# Patient Record
Sex: Female | Born: 1980 | Race: White | Hispanic: No | Marital: Single | Smoking: Former smoker
Health system: Southern US, Community
[De-identification: ages and names within clinical notes are randomized; demographics above are authoritative.]

## PROBLEM LIST (undated history)

## (undated) DIAGNOSIS — N809 Endometriosis, unspecified: Secondary | ICD-10-CM

## (undated) DIAGNOSIS — N200 Calculus of kidney: Secondary | ICD-10-CM

## (undated) DIAGNOSIS — N83209 Unspecified ovarian cyst, unspecified side: Secondary | ICD-10-CM

## (undated) DIAGNOSIS — Z789 Other specified health status: Secondary | ICD-10-CM

## (undated) HISTORY — PX: TONSILLECTOMY: SUR1361

## (undated) HISTORY — PX: LAPAROSCOPY: SHX197

---

## 2005-02-12 ENCOUNTER — Emergency Department: Payer: Self-pay | Admitting: Unknown Physician Specialty

## 2005-04-24 ENCOUNTER — Other Ambulatory Visit: Payer: Self-pay

## 2005-04-24 ENCOUNTER — Emergency Department: Payer: Self-pay | Admitting: Emergency Medicine

## 2005-05-18 ENCOUNTER — Emergency Department: Payer: Self-pay | Admitting: Emergency Medicine

## 2005-05-18 ENCOUNTER — Other Ambulatory Visit: Payer: Self-pay

## 2005-08-05 ENCOUNTER — Emergency Department: Payer: Self-pay | Admitting: Emergency Medicine

## 2007-01-14 ENCOUNTER — Emergency Department: Payer: Self-pay | Admitting: Emergency Medicine

## 2007-05-01 ENCOUNTER — Emergency Department: Payer: Self-pay | Admitting: Emergency Medicine

## 2008-05-10 ENCOUNTER — Ambulatory Visit: Payer: Self-pay | Admitting: *Deleted

## 2009-02-25 ENCOUNTER — Emergency Department: Payer: Self-pay | Admitting: Emergency Medicine

## 2009-03-11 ENCOUNTER — Ambulatory Visit: Payer: Self-pay | Admitting: Family Medicine

## 2009-07-04 ENCOUNTER — Emergency Department: Payer: Self-pay | Admitting: Emergency Medicine

## 2010-11-03 ENCOUNTER — Emergency Department: Payer: Self-pay | Admitting: Emergency Medicine

## 2010-11-05 ENCOUNTER — Emergency Department: Payer: Self-pay | Admitting: Emergency Medicine

## 2012-04-10 ENCOUNTER — Emergency Department: Payer: Self-pay | Admitting: Emergency Medicine

## 2012-04-10 LAB — URINALYSIS, COMPLETE
Bilirubin,UR: NEGATIVE
Glucose,UR: NEGATIVE mg/dL (ref 0–75)
Leukocyte Esterase: NEGATIVE
Nitrite: NEGATIVE
Ph: 6 (ref 4.5–8.0)
Protein: NEGATIVE
RBC,UR: 40 /HPF (ref 0–5)
Specific Gravity: 1.024 (ref 1.003–1.030)
Squamous Epithelial: 1
WBC UR: 1 /HPF (ref 0–5)

## 2012-04-10 LAB — PREGNANCY, URINE: Pregnancy Test, Urine: NEGATIVE m[IU]/mL

## 2012-05-14 ENCOUNTER — Emergency Department: Payer: Self-pay | Admitting: Internal Medicine

## 2012-07-05 ENCOUNTER — Emergency Department: Payer: Self-pay | Admitting: Emergency Medicine

## 2012-07-05 LAB — URINALYSIS, COMPLETE
Bilirubin,UR: NEGATIVE
Blood: NEGATIVE
Glucose,UR: NEGATIVE mg/dL (ref 0–75)
Ketone: NEGATIVE
Nitrite: NEGATIVE
Ph: 7 (ref 4.5–8.0)
Protein: NEGATIVE
RBC,UR: 5 /HPF (ref 0–5)
Specific Gravity: 1.02 (ref 1.003–1.030)
Squamous Epithelial: 3
WBC UR: 2 /HPF (ref 0–5)

## 2012-12-13 ENCOUNTER — Emergency Department: Payer: Self-pay | Admitting: Emergency Medicine

## 2012-12-13 LAB — URINALYSIS, COMPLETE
Bacteria: NONE SEEN
Bilirubin,UR: NEGATIVE
Glucose,UR: NEGATIVE mg/dL (ref 0–75)
Ketone: NEGATIVE
Leukocyte Esterase: NEGATIVE
Nitrite: NEGATIVE
Ph: 5 (ref 4.5–8.0)
Protein: NEGATIVE
RBC,UR: 1 /HPF (ref 0–5)
Specific Gravity: 1.02 (ref 1.003–1.030)
Squamous Epithelial: 1
WBC UR: 1 /HPF (ref 0–5)

## 2012-12-13 LAB — WET PREP, GENITAL

## 2012-12-13 LAB — HCG, QUANTITATIVE, PREGNANCY: Beta Hcg, Quant.: <1 m[IU]/mL — ABNORMAL LOW

## 2012-12-22 ENCOUNTER — Encounter (HOSPITAL_COMMUNITY): Payer: Self-pay | Admitting: *Deleted

## 2012-12-22 ENCOUNTER — Inpatient Hospital Stay (HOSPITAL_COMMUNITY): Payer: Self-pay

## 2012-12-22 ENCOUNTER — Inpatient Hospital Stay (HOSPITAL_COMMUNITY)
Admission: AD | Admit: 2012-12-22 | Discharge: 2012-12-23 | Disposition: A | Discharge status: Home / Self Care | Payer: Self-pay | Source: Ambulatory Visit | Attending: Obstetrics & Gynecology | Admitting: Obstetrics & Gynecology

## 2012-12-22 DIAGNOSIS — M549 Dorsalgia, unspecified: Secondary | ICD-10-CM | POA: Insufficient documentation

## 2012-12-22 DIAGNOSIS — K521 Toxic gastroenteritis and colitis: Secondary | ICD-10-CM

## 2012-12-22 DIAGNOSIS — R110 Nausea: Secondary | ICD-10-CM | POA: Insufficient documentation

## 2012-12-22 DIAGNOSIS — N949 Unspecified condition associated with female genital organs and menstrual cycle: Secondary | ICD-10-CM | POA: Insufficient documentation

## 2012-12-22 DIAGNOSIS — S3991XA Unspecified injury of abdomen, initial encounter: Secondary | ICD-10-CM

## 2012-12-22 DIAGNOSIS — K5289 Other specified noninfective gastroenteritis and colitis: Secondary | ICD-10-CM | POA: Insufficient documentation

## 2012-12-22 HISTORY — DX: Other specified health status: Z78.9

## 2012-12-22 LAB — URINALYSIS, ROUTINE W REFLEX MICROSCOPIC
Bilirubin Urine: NEGATIVE
Glucose, UA: NEGATIVE mg/dL
Ketones, ur: NEGATIVE mg/dL
Leukocytes, UA: NEGATIVE
Nitrite: NEGATIVE
Protein, ur: NEGATIVE mg/dL
Specific Gravity, Urine: 1.015 (ref 1.005–1.030)
Urobilinogen, UA: 0.2 mg/dL (ref 0.0–1.0)
pH: 7 (ref 5.0–8.0)

## 2012-12-22 LAB — POCT PREGNANCY, URINE: Preg Test, Ur: NEGATIVE

## 2012-12-22 LAB — CBC
HCT: 39.7 % (ref 36.0–46.0)
Hemoglobin: 13.3 g/dL (ref 12.0–15.0)
MCH: 29.3 pg (ref 26.0–34.0)
MCHC: 33.5 g/dL (ref 30.0–36.0)
MCV: 87.4 fL (ref 78.0–100.0)
Platelets: 271 10*3/uL (ref 150–400)
RBC: 4.54 MIL/uL (ref 3.87–5.11)
RDW: 13 % (ref 11.5–15.5)
WBC: 9.8 10*3/uL (ref 4.0–10.5)

## 2012-12-22 LAB — URINE MICROSCOPIC-ADD ON

## 2012-12-22 MED ORDER — OXYCODONE-ACETAMINOPHEN 5-325 MG PO TABS
2.0000 | ORAL_TABLET | ORAL | Status: AC
  Administered 2012-12-23: 2 via ORAL
  Filled 2012-12-22: qty 2

## 2012-12-22 MED ORDER — OXYCODONE-ACETAMINOPHEN 5-325 MG PO TABS: 2.0000 | ORAL_TABLET | ORAL | Status: DC

## 2012-12-22 NOTE — MAU Provider Note (Signed)
Chief Complaint: No chief complaint on file.   First Provider Initiated Contact with Patient 12/22/12 2216     SUBJECTIVE HPI: Crystal Riley is a 32 y.o. [id number] who presents to maternity admissions reporting pelvic pain , mid abdominal pain, and back pain x3 weeks since falling down stairs at her apartment. She was seen a [facility] for this 1 week ago and was diagnosed with BV and PID and had negative testing for STDs.  She has 6 days left of her PO treatment for PID (following injection given in ER).  Her pain continues in her pelvis and she now has mid abdominal pain on right side that is sharp, stabbing pain that worsens with movement. She also reports daily nausea since the pain started.  Patient's last menstrual period was 10/25/2012.  She has light spotting since her Mirena IUD was placed in 2011.  She vaginal bleeding, vaginal itching/burning, urinary symptoms, h/a, dizziness, or fever/chills.    She lost her insurance and does not currently have a gyn provider.    Past Medical History  Diagnosis Date  . Medical history non-contributory    Past Surgical History  Procedure Laterality Date  . Laparoscopy    . Tonsillectomy     History   Social History  . Marital Status: Single    Spouse Name: N/A    Number of Children: N/A  . Years of Education: N/A   Occupational History  . Not on file.   Social History Main Topics  . Smoking status: Not on file  . Smokeless tobacco: Not on file  . Alcohol Use: 1.1 oz/week    1 Glasses of wine, 1 Drinks containing 0.5 oz of alcohol per week  . Drug Use: Yes    Special: Marijuana  . Sexually Active: Yes   Other Topics Concern  . Not on file   Social History Narrative  . No narrative on file   No current facility-administered medications on file prior to encounter.   No current outpatient prescriptions on file prior to encounter.   Allergies  Allergen Reactions  . Bactrim (Sulfamethoxazole-Tmp Ds) Swelling and Rash  .  Latex Swelling and Rash  . Tylenol With Codeine #3 (Acetaminophen-Codeine) Rash    ROS: Pertinent items in HPI  OBJECTIVE Blood pressure 118/66, pulse 95, temperature 98.1 F (36.7 C), temperature source Oral, resp. rate 20, height 5' (1.524 m), weight 65.998 kg (145 lb 8 oz), last menstrual period 10/25/2012. GENERAL: Well-developed, well-nourished female in no acute distress.  HEENT: Normocephalic HEART: normal rate RESP: normal effort ABDOMEN: Soft, non-tender EXTREMITIES: significant tenderness in upper thighs and inguinal region, no abdominal tenderness NEURO: Alert and oriented SPECULUM EXAM: Deferred--pt had these ~1 week ago  LAB RESULTS Results for orders placed during the hospital encounter of 12/22/12 (from the past 24 hour(s))  URINALYSIS, ROUTINE W REFLEX MICROSCOPIC     Status: Abnormal   Collection Time    12/22/12  9:12 PM      Result Value Range   Color, Urine YELLOW  YELLOW   APPearance CLEAR  CLEAR   Specific Gravity, Urine 1.015  1.005 - 1.030   pH 7.0  5.0 - 8.0   Glucose, UA NEGATIVE  NEGATIVE mg/dL   Hgb urine dipstick TRACE (*) NEGATIVE   Bilirubin Urine NEGATIVE  NEGATIVE   Ketones, ur NEGATIVE  NEGATIVE mg/dL   Protein, ur NEGATIVE  NEGATIVE mg/dL   Urobilinogen, UA 0.2  0.0 - 1.0 mg/dL   Nitrite NEGATIVE  NEGATIVE  Leukocytes, UA NEGATIVE  NEGATIVE  URINE MICROSCOPIC-ADD ON     Status: None   Collection Time    12/22/12  9:12 PM      Result Value Range   Squamous Epithelial / LPF RARE  RARE   WBC, UA 0-2  <3 WBC/hpf   RBC / HPF 0-2  <3 RBC/hpf   Bacteria, UA RARE  RARE  POCT PREGNANCY, URINE     Status: None   Collection Time    12/22/12  9:48 PM      Result Value Range   Preg Test, Ur NEGATIVE  NEGATIVE  CBC     Status: None   Collection Time    12/22/12 11:40 PM      Result Value Range   WBC 9.8  4.0 - 10.5 K/uL   RBC 4.54  3.87 - 5.11 MIL/uL   Hemoglobin 13.3  12.0 - 15.0 g/dL   HCT 16.1  09.6 - 04.5 %   MCV 87.4  78.0 - 100.0 fL    MCH 29.3  26.0 - 34.0 pg   MCHC 33.5  30.0 - 36.0 g/dL   RDW 40.9  81.1 - 91.4 %   Platelets 271  150 - 400 K/uL    IMAGING US Abdomen Complete  12/22/2012  *RADIOLOGY REPORT*  Clinical Data:  32 year old female with abdominal pain.  ABDOMINAL ULTRASOUND COMPLETE  Comparison:  None  Findings:  Gallbladder:  The gallbladder is unremarkable. There is no evidence of gallstones, gallbladder wall thickening, or pericholecystic fluid.  Common Bile Duct:  There is no evidence of intrahepatic or extrahepatic biliary dilation. The CBD measures 3.4 mm in greatest diameter.  Liver:  The liver is within normal limits in parenchymal echogenicity.  A 1.2 cm right hemangioma is identified.  No other focal abnormalities are noted.  IVC:  Appears normal.  Pancreas:  Although the pancreas is difficult to visualize in its entirety, no focal pancreatic abnormality is identified.  Spleen:  Within normal limits in size and echotexture.  Right kidney:  The right kidney is normal in size and parenchymal echogenicity.  There is no evidence of solid mass, hydronephrosis or definite renal calculi.  The right kidney measures 9.1 cm.  Left kidney:  The left kidney is normal in size and parenchymal echogenicity.  There is no evidence of solid mass, hydronephrosis or definite renal calculi.   The left kidney measures 9.1 cm.  Abdominal Aorta:  No abdominal aortic aneurysm identified.  There is no evidence of ascites.  IMPRESSION: No acute or significant abnormalities identified.   Original Report Authenticated By: Harmon Pier, M.D.    US Transvaginal Non-ob  12/22/2012  *RADIOLOGY REPORT*  Clinical Data:   32 year old female with pelvic pain and fall. History of endometriosis and IUD.  TRANSABDOMINAL AND TRANSVAGINAL ULTRASOUND OF PELVIS Technique:  Both transabdominal and transvaginal ultrasound examinations of the pelvis were performed. Transabdominal technique was performed for global imaging of the pelvis including uterus,  ovaries, adnexal regions, and pelvic cul-de-sac.  It was necessary to proceed with endovaginal exam following the transabdominal exam to visualize the ovaries and endometrium.  Comparison:  None  Findings:  Uterus: The uterus is anteverted measuring 7.3 x 3 x 3.1 cm.  No focal uterine masses are identified.  Endometrium: An IUD is identified and appears in satisfactory position.  Endometrial stripe measures 3 mm in diameter.  Right ovary:  The right ovary is unremarkable measuring 2.8 x 1.4 x 2 cm.  Left ovary: The left  ovary is unremarkable measuring 2.4 x 1.4 x 1.6 cm.  Other findings: There is no evidence of free fluid or adnexal mass.  IMPRESSION: IUD, otherwise normal study.   Original Report Authenticated By: Harmon Pier, M.D.    US Pelvis Complete  12/22/2012  *RADIOLOGY REPORT*  Clinical Data:   32 year old female with pelvic pain and fall. History of endometriosis and IUD.  TRANSABDOMINAL AND TRANSVAGINAL ULTRASOUND OF PELVIS Technique:  Both transabdominal and transvaginal ultrasound examinations of the pelvis were performed. Transabdominal technique was performed for global imaging of the pelvis including uterus, ovaries, adnexal regions, and pelvic cul-de-sac.  It was necessary to proceed with endovaginal exam following the transabdominal exam to visualize the ovaries and endometrium.  Comparison:  None  Findings:  Uterus: The uterus is anteverted measuring 7.3 x 3 x 3.1 cm.  No focal uterine masses are identified.  Endometrium: An IUD is identified and appears in satisfactory position.  Endometrial stripe measures 3 mm in diameter.  Right ovary:  The right ovary is unremarkable measuring 2.8 x 1.4 x 2 cm.  Left ovary: The left ovary is unremarkable measuring 2.4 x 1.4 x 1.6 cm.  Other findings: There is no evidence of free fluid or adnexal mass.  IMPRESSION: IUD, otherwise normal study.   Original Report Authenticated By: Harmon Pier, M.D.     ASSESSMENT 1. Injury of groin, initial encounter   2.  Antibiotic enterocolitis   3. Nausea alone     PLAN Discharge home Take probiotic and/or eat yogurt daily Complete abx therapy for PID Ibuprofen 600 mg Q 6 hours Percocet x15 tabs, take Q 6 hours PRN Phenergan 12.5-25 mg Q 6 hours PRN Message sent to Gyn clinic for routine care F/U with primary care provider if no improvement in symptoms Return to MAU as needed    Medication List    ASK your doctor about these medications       doxycycline 100 MG EC tablet  Commonly known as:  DORYX  Take 100 mg by mouth 2 (two) times daily.     ibuprofen 200 MG tablet  Commonly known as:  ADVIL,MOTRIN  Take 800 mg by mouth every 6 (six) hours as needed for pain.     SYSTANE CONTACTS Soln  Apply 1-2 drops to eye as needed (For dry eyes and allergies.).         Sharen Counter Certified Nurse-Midwife 12/22/2012  10:44 PM

## 2012-12-22 NOTE — MAU Note (Signed)
Pt states she fell on the 12/13/2012 pain  And pt states she feel blotted.Pt states she was not able to buckle her pants this morning because she is so bloted.Pt states she also feels pain the her back  On the right.

## 2012-12-22 NOTE — MAU Note (Addendum)
PT SAYS HAS IUD  IN --INSERTED IN 05-2010-  AT [facility] - DR STEEGE- PAIN GYN SPEC- BECAUSE  OF ENDOMETROSIS.   LAST SEX- 11-27-2012    PT WENT TO [facility] ON 12-13-2012-   BECAUSE SHE FELL DOWNSTAIRS FROM ICE ON 2-15 -  WAS IN PAIN- IN PELVIS  AND LOW BACK PAIN.  THEY DID PAP  AND STD TEST- TOLD HER SHE HAD BV AND POSSIBE PID- GAVE HER INJECTION AND   FLAGLY AND DOXY-  NO CALL FROM THEM IF POSITIVE     AND PAIN IS STILL THERE.  STILL FEELS NAUSEA- NO VOMITING.   FEELS BLOATED AND   HAS A LOT OF BM- NOT DIARRHEA,     SHE TRIED TO MAKE AN APPOINTMENT  WITH FAMILY DR-  TOLD BOOKED  FOR 2 WEEKS  .  DID  BLOOD AND UPT- NEG.    AT 5PM -  TOOK MOTRIN

## 2012-12-23 ENCOUNTER — Encounter: Payer: Self-pay | Admitting: Advanced Practice Midwife

## 2012-12-23 ENCOUNTER — Other Ambulatory Visit: Payer: Self-pay | Admitting: Advanced Practice Midwife

## 2012-12-23 DIAGNOSIS — IMO0002 Reserved for concepts with insufficient information to code with codable children: Secondary | ICD-10-CM

## 2012-12-23 MED ORDER — ONDANSETRON HCL 4 MG PO TABS: 4.0000 mg | ORAL_TABLET | Freq: Every day | ORAL | Status: AC | PRN

## 2012-12-23 MED ORDER — IBUPROFEN 600 MG PO TABS: 600.0000 mg | ORAL_TABLET | Freq: Four times a day (QID) | ORAL | Status: DC | PRN

## 2012-12-23 MED ORDER — HYDROCODONE-ACETAMINOPHEN 5-500 MG PO TABS: 1.0000 | ORAL_TABLET | ORAL | Status: DC | PRN

## 2012-12-23 NOTE — Progress Notes (Signed)
Pt called in stating that she did not receive printed rx for percocet last night upon discharge, she thought rx had been sent to pharmacy. Requesting new copy of rx, states she did not get it filled, tearful on the phone. Taking motrin with no relief. Rx sent to pharmacy for vicodin 5/500 i po q 4 hours PRN pain, disp #10. Discussed with patient that we would not be able to call in any more prescriptions for pain medicine.

## 2012-12-28 NOTE — MAU Provider Note (Signed)
Attestation of Attending Supervision of Advanced Practitioner (CNM/NP): Evaluation and management procedures were performed by the Advanced Practitioner under my supervision and collaboration. I have reviewed the Advanced Practitioner's note and chart, and I agree with the management and plan.  Dniyah Grant H. 12:28 PM   

## 2013-01-30 ENCOUNTER — Encounter: Payer: Self-pay | Admitting: Family

## 2014-08-27 ENCOUNTER — Encounter (HOSPITAL_COMMUNITY): Payer: Self-pay | Admitting: *Deleted

## 2014-11-22 ENCOUNTER — Emergency Department: Payer: Self-pay | Admitting: Student

## 2014-12-31 ENCOUNTER — Emergency Department: Payer: Self-pay | Admitting: Emergency Medicine

## 2015-04-12 ENCOUNTER — Encounter: Payer: Self-pay | Admitting: Emergency Medicine

## 2015-04-12 ENCOUNTER — Emergency Department
Admission: EM | Admit: 2015-04-12 | Discharge: 2015-04-12 | Disposition: A | Discharge status: Home / Self Care | Payer: Self-pay | Attending: Emergency Medicine | Admitting: Emergency Medicine

## 2015-04-12 DIAGNOSIS — Y990 Civilian activity done for income or pay: Secondary | ICD-10-CM | POA: Insufficient documentation

## 2015-04-12 DIAGNOSIS — Y9289 Other specified places as the place of occurrence of the external cause: Secondary | ICD-10-CM | POA: Insufficient documentation

## 2015-04-12 DIAGNOSIS — R202 Paresthesia of skin: Secondary | ICD-10-CM

## 2015-04-12 DIAGNOSIS — X58XXXA Exposure to other specified factors, initial encounter: Secondary | ICD-10-CM | POA: Insufficient documentation

## 2015-04-12 DIAGNOSIS — Z720 Tobacco use: Secondary | ICD-10-CM | POA: Insufficient documentation

## 2015-04-12 DIAGNOSIS — Z91040 Latex allergy status: Secondary | ICD-10-CM | POA: Insufficient documentation

## 2015-04-12 DIAGNOSIS — Z792 Long term (current) use of antibiotics: Secondary | ICD-10-CM | POA: Insufficient documentation

## 2015-04-12 DIAGNOSIS — Y9389 Activity, other specified: Secondary | ICD-10-CM | POA: Insufficient documentation

## 2015-04-12 DIAGNOSIS — S60221A Contusion of right hand, initial encounter: Secondary | ICD-10-CM | POA: Insufficient documentation

## 2015-04-12 NOTE — ED Notes (Signed)
Noticed bruising to top of right hand this am  Now having some numbness to right arm unsure of injury

## 2015-04-12 NOTE — ED Notes (Signed)
Patient reports waking this morning with bruise to right hand, throughout the day she has had shooting pains to right arm and now most recently she is having numbness and tingling that is radiating up into shoulder.

## 2015-04-12 NOTE — Discharge Instructions (Signed)
Hand Contusion  A hand contusion is a deep bruise to the hand. Contusions happen when an injury causes bleeding under the skin. Signs of bruising include pain, puffiness (swelling), and discolored skin. The contusion may turn blue, purple, or yellow. HOME CARE  Put ice on the injured area.  Put ice in a plastic bag.  Place a towel between your skin and the bag.  Leave the ice on for 15-20 minutes, 03-04 times a day.  Only take medicines as told by your doctor.  Use an elastic wrap only as told. You may remove the wrap for sleeping, showering, and bathing. Take the wrap off if you lose feeling (have numbness) in your fingers, or they turn blue or cold. Put the wrap on more loosely.  Keep the hand raised (elevated) with pillows.  Avoid using your hand too much if it is painful. GET HELP RIGHT AWAY IF:   You have more redness, puffiness, or pain in your hand.  Your puffiness or pain does not get better with medicine.  You lose feeling in your hand, or you cannot move your fingers.  Your hand turns cold or blue.  You have pain when you move your fingers.  Your hand feels warm.  Your contusion does not get better in 2 days. MAKE SURE YOU:   Understand these instructions.  Will watch this condition.  Will get help right away if you are not doing well or you get worse. Document Released: 03/30/2008 Document Revised: 02/26/2014 Document Reviewed: 04/04/2012 [facility] Patient Information 2015 [place], [place]. This information is not intended to replace advice given to you by your health care provider. Make sure you discuss any questions you have with your health care provider.  Paresthesia Paresthesia is a burning or prickling feeling. This feeling can happen in any part of the body. It often happens in the hands, arms, legs, or feet. HOME CARE  Avoid drinking alcohol.  Try massage or needle therapy (acupuncture) to help with your problems.  Keep all doctor visits as  told. GET HELP RIGHT AWAY IF:   You feel weak.  You have trouble walking or moving.  You have problems speaking or seeing.  You feel confused.  You cannot control when you poop (bowel movement) or pee (urinate).  You lose feeling (numbness) after an injury.  You pass out (faint).  Your burning or prickling feeling gets worse when you walk.  You have pain, cramps, or feel dizzy.  You have a rash. MAKE SURE YOU:   Understand these instructions.  Will watch your condition.  Will get help right away if you are not doing well or get worse. Document Released: 09/24/2008 Document Revised: 01/04/2012 Document Reviewed: 07/03/2011 [facility] Patient Information 2015 [place], [place]. This information is not intended to replace advice given to you by your health care provider. Make sure you discuss any questions you have with your health care provider.  Your exam was normal.  You likely hit and bruised the hand without knowing it.  Apply ice to reduce pain and bruising.  Take OTC Tylenol or Motrin as needed for pain.

## 2015-04-12 NOTE — ED Notes (Signed)
Extremity pulses wnl.

## 2015-04-12 NOTE — ED Provider Notes (Signed)
[facility] Emergency Department Provider Note ____________________________________________  Time seen: 1935  I have reviewed the triage vital signs and the nursing notes.  HISTORY  Chief Complaint  Extremity Pain and Numbness  HPI Crystal Riley is a 34 y.o. female right-hand-dominant, who reports to the ED noting bruising to the top of right hand this morning. She describes that she noted bruising while at work after she washed her hands. She denies any known injury or trauma today, but recalls that she may have inadvertently hit her hand yesterday.Today she describes intermittent and fleeting sensations of swelling to the dorsal hand, and some tingling to the fingertips. At this time she reports those symptoms have improved. She was concerned about clotting in the hand giving some history of blood clots in her grandmother, and that is what prompted her to report to the ED today for evaluation and management  Past Medical History  Diagnosis Date  . Medical history non-contributory    There are no active problems to display for this patient.  Past Surgical History  Procedure Laterality Date  . Laparoscopy    . Tonsillectomy      Current Outpatient Rx  Name  Route  Sig  Dispense  Refill  . Artificial Tear Solution (SYSTANE CONTACTS) SOLN   Ophthalmic   Apply 1-2 drops to eye as needed (For dry eyes and allergies.).         Marland Kitchen doxycycline (DORYX) 100 MG EC tablet   Oral   Take 100 mg by mouth 2 (two) times daily.         Marland Kitchen HYDROcodone-acetaminophen (VICODIN) 5-500 MG per tablet   Oral   Take 1 tablet by mouth every 4 (four) hours as needed for pain.   10 tablet   0   . ibuprofen (ADVIL,MOTRIN) 600 MG tablet   Oral   Take 1 tablet (600 mg total) by mouth every 6 (six) hours as needed for pain.   30 tablet   1   . oxyCODONE-acetaminophen (PERCOCET/ROXICET) 5-325 MG per tablet   Oral   Take 2 tablets by mouth stat.   15 tablet   0     Allergies Bactrim; Latex; and Tylenol with codeine #3  Family History  Problem Relation Age of Onset  . Diabetes Mother   . Hypertension Mother   . Pancreatitis Mother   . Endometriosis Mother   . Cancer Paternal Grandfather     Social History History  Substance Use Topics  . Smoking status: Current Every Day Smoker  . Smokeless tobacco: Not on file  . Alcohol Use: No   Review of Systems  Constitutional: Negative for fever. Eyes: Negative for visual changes. ENT: Negative for sore throat. Cardiovascular: Negative for chest pain. Respiratory: Negative for shortness of breath. Gastrointestinal: Negative for abdominal pain, vomiting and diarrhea. Genitourinary: Negative for dysuria. Musculoskeletal: Negative for back pain. Hand pain, swelling, bruising as above. Skin: Negative for rash. Neurological: Negative for headaches, focal weakness or numbness. Hand paresthesias as above. ____________________________________________  PHYSICAL EXAM:  VITAL SIGNS: ED Triage Vitals  Enc Vitals Group     BP 04/12/15 1737 125/83 mmHg     Pulse Rate 04/12/15 1737 98     Resp 04/12/15 1737 20     Temp 04/12/15 1737 98.3 F (36.8 C)     Temp Source 04/12/15 1737 Oral     SpO2 04/12/15 1737 99 %     Weight 04/12/15 1737 127 lb (57.607 kg)  Height 04/12/15 1737 5\' 1"  (1.549 m)     Head Cir --      Peak Flow --      Pain Score 04/12/15 1738 8     Pain Loc --      Pain Edu? --      Excl. in GC? --    Constitutional: Alert and oriented. Well appearing and in no distress. Eyes: Normocephalic and atraumatic.  Conjunctivae are normal. PERRL. Normal extraocular movements. Cardiovascular: Normal rate, regular rhythm. Normal distal pulses and capillary refill. Respiratory: Normal respiratory effort. No wheezes/rales/rhonchi. Gastrointestinal: Soft and nontender. No distention. Musculoskeletal: Nontender with normal range of motion in all extremities. Normal composite fists.   Neurologic:  Normal gait without ataxia. Normal speech and language. No gross focal neurologic deficits are appreciated. Normal intrinsic & opposition testing of the hands.  Skin:  Skin is warm, dry and intact. No rash noted. Small, focal area of ecchymosis noted to the dorsal right hand. No laceration, lesion, or cellulitis appreciated.  Psychiatric: Mood and affect are normal. Patient exhibits appropriate insight and judgment. ____________________________________________  INITIAL IMPRESSION / ASSESSMENT AND PLAN / ED COURSE  Dorsal hand contusion with delayed bruising. Localized swelling causing resolved hand paresthesias. Normal exam with reassurance to the patient about the cause and effect of local trauma. Suggest ice and ibuprofen for pain and swelling. Work note for remainder of shift tonight as requested.  ____________________________________________  FINAL CLINICAL IMPRESSION(S) / ED DIAGNOSES  Final diagnoses:  Hand contusion, right, initial encounter  Paresthesias in right hand     Lissa Hoard, PA-C 04/12/15 2048  Sharman Cheek, MD 04/12/15 2336

## 2015-04-30 ENCOUNTER — Emergency Department
Admission: EM | Admit: 2015-04-30 | Discharge: 2015-04-30 | Disposition: A | Discharge status: Home / Self Care | Payer: Self-pay | Attending: Emergency Medicine | Admitting: Emergency Medicine

## 2015-04-30 ENCOUNTER — Encounter: Payer: Self-pay | Admitting: *Deleted

## 2015-04-30 DIAGNOSIS — Z91040 Latex allergy status: Secondary | ICD-10-CM | POA: Insufficient documentation

## 2015-04-30 DIAGNOSIS — J020 Streptococcal pharyngitis: Secondary | ICD-10-CM | POA: Insufficient documentation

## 2015-04-30 DIAGNOSIS — Z720 Tobacco use: Secondary | ICD-10-CM | POA: Insufficient documentation

## 2015-04-30 DIAGNOSIS — J029 Acute pharyngitis, unspecified: Secondary | ICD-10-CM

## 2015-04-30 HISTORY — DX: Endometriosis, unspecified: N80.9

## 2015-04-30 MED ORDER — FLUCONAZOLE 150 MG PO TABS: 150.0000 mg | ORAL_TABLET | Freq: Every day | ORAL | Status: DC

## 2015-04-30 MED ORDER — IBUPROFEN 600 MG PO TABS: 600.0000 mg | ORAL_TABLET | Freq: Three times a day (TID) | ORAL | Status: DC | PRN

## 2015-04-30 MED ORDER — AMOXICILLIN 875 MG PO TABS: 875.0000 mg | ORAL_TABLET | Freq: Two times a day (BID) | ORAL | Status: DC

## 2015-04-30 MED ORDER — IBUPROFEN 600 MG PO TABS: 600.0000 mg | ORAL_TABLET | Freq: Four times a day (QID) | ORAL | Status: DC | PRN

## 2015-04-30 NOTE — ED Notes (Signed)
Pt here with c/o sore throat with fever that started this am.

## 2015-04-30 NOTE — ED Provider Notes (Signed)
[facility] Emergency Department Provider Note  ____________________________________________  Time seen: Approximately 4:46 PM  I have reviewed the triage vital signs and the nursing notes.   HISTORY  Chief Complaint Fever   HPI Crystal Riley is a 34 y.o. female presents to the ER for complaints of sore throat x 2 days and fever. States did not check fever but felt warm. Reports 2 coworkers with strep throat this past week. Denies cough, congestion, runny nose. States occasional earache. Reports continues to drink and eat well but hurts to swallow. Denies vomiting, diarrhea, abdominal pain, chest pain or shortness of breath.    Past Medical History  Diagnosis Date  . Medical history non-contributory   . Endometriosis     There are no active problems to display for this patient.   Past Surgical History  Procedure Laterality Date  . Laparoscopy    . Tonsillectomy      Current Outpatient Rx  Name  Route  Sig  Dispense  Refill  . none          .           .           .           .             Allergies Bactrim; Latex; and Tylenol with codeine #3  Family History  Problem Relation Age of Onset  . Diabetes Mother   . Hypertension Mother   . Pancreatitis Mother   . Endometriosis Mother   . Cancer Paternal Grandfather     Social History History  Substance Use Topics  . Smoking status: Current Every Day Smoker -- 1.00 packs/day    Types: Cigarettes, Cigars  . Smokeless tobacco: Not on file  . Alcohol Use: 1.1 oz/week    1 Glasses of wine, 1 Standard drinks or equivalent per week    Review of Systems Constitutional: positive fever per pt Eyes: No visual changes. ENT: positive sore throat. Cardiovascular: Denies chest pain. Respiratory: Denies shortness of breath. Gastrointestinal: No abdominal pain.  No nausea, no vomiting.  No diarrhea.  No constipation. Genitourinary: Negative for dysuria. Musculoskeletal: Negative for back  pain. Skin: Negative for rash. Neurological: Negative for headaches, focal weakness or numbness.  10-point ROS otherwise negative.  ____________________________________________   PHYSICAL EXAM:  VITAL SIGNS: ED Triage Vitals  Enc Vitals Group     BP 04/30/15 1531 112/72 mmHg     Pulse Rate 04/30/15 1531 76     Resp -- 18     Temp 04/30/15 1531 99.1 F (37.3 C)     Temp Source 04/30/15 1531 Oral     SpO2 04/30/15 1531 99 %     Weight 04/30/15 1531 128 lb (58.06 kg)     Height 04/30/15 1531  (1.549 m)     Head Cir --      Peak Flow --      Pain Score 04/30/15 1531 3     Pain Loc --      Pain Edu? --      Excl. in GC? --     Constitutional: Alert and oriented. Well appearing and in no acute distress. Eyes: Conjunctivae are normal. PERRL. EOMI. Head: Atraumatic. Ears: no erythema, normal TMs.  Nose: No congestion/rhinnorhea. Mouth/Throat: Mucous membranes are moist.  Pharynx mod erythema. Tonsils surgical absent. No uvular deviation. No exudate.  Neck: No stridor.  No cervical spine tenderness to palpation.  Hematological/Lymphatic/Immunilogical: mild anterior cervical lymphadenopathy.  Cardiovascular: Normal rate, regular rhythm. Grossly normal heart sounds.  Good peripheral circulation. Respiratory: Normal respiratory effort.  No retractions. Lungs CTAB. Gastrointestinal: Soft and nontender. No distention. Normal bowel sounds. No CVA tenderness. Musculoskeletal: No lower extremity tenderness nor edema.  No joint effusions. Neurologic:  Normal speech and language. No gross focal neurologic deficits are appreciated. Speech is normal. No gait instability. Skin:  Skin is warm, dry and intact. No rash noted. Psychiatric: Mood and affect are normal. Speech and behavior are normal.  ____________________________________________   LABS (all labs ordered are listed, but only abnormal results are displayed)  Labs Reviewed - No data to  display ____________________________________________   INITIAL IMPRESSION / ASSESSMENT AND PLAN / ED COURSE  Pertinent labs & imaging results that were available during my care of the patient were reviewed by me and considered in my medical decision making (see chart for details).  Well appearing. No acute distress. Sore throat x 2 days. Recently exposed to strep by two coworkers. Pharyngeal erythema. Denies other complaints. Suspect streptococcal pharyngitis. Will treat with oral amoxicillin and ibuprofen. Rest, fluids. Pt requests diflucan as often has vaginal yeast infections with oral antibiotics. Follow up with PCP. Discussed return parameters.  ____________________________________________   FINAL CLINICAL IMPRESSION(S) / ED DIAGNOSES  Final diagnoses:  Pharyngitis  Strep throat      Renford DillsLindsey Carlas Vandyne, NP 04/30/15 1807  Sharyn CreamerMark Quale, MD 05/02/15 [id number]

## 2015-04-30 NOTE — Discharge Instructions (Signed)
Take medication as prescribed. Drink plenty of water. Rest.   Follow up with your primary care physician or the above as needed.   Return to ER for new or worsening concerns.   Pharyngitis Pharyngitis is a sore throat (pharynx). There is redness, pain, and swelling of your throat. HOME CARE   Drink enough fluids to keep your pee (urine) clear or pale yellow.  Only take medicine as told by your doctor.  You may get sick again if you do not take medicine as told. Finish your medicines, even if you start to feel better.  Do not take aspirin.  Rest.  Rinse your mouth (gargle) with salt water ( tsp of salt per 1 qt of water) every 1-2 hours. This will help the pain.  If you are not at risk for choking, you can suck on hard candy or sore throat lozenges. GET HELP IF:  You have large, tender lumps on your neck.  You have a rash.  You cough up green, yellow-brown, or bloody spit. GET HELP RIGHT AWAY IF:   You have a stiff neck.  You drool or cannot swallow liquids.  You throw up (vomit) or are not able to keep medicine or liquids down.  You have very bad pain that does not go away with medicine.  You have problems breathing (not from a stuffy nose). MAKE SURE YOU:   Understand these instructions.  Will watch your condition.  Will get help right away if you are not doing well or get worse. Document Released: 03/30/2008 Document Revised: 08/02/2013 Document Reviewed: 06/19/2013 [facility] Patient Information 2015 [place], [place]. This information is not intended to replace advice given to you by your health care provider. Make sure you discuss any questions you have with your health care provider.  Strep Throat Strep throat is an infection of the throat. It is caused by a germ. Strep throat spreads from person to person by coughing, sneezing, or close contact. HOME CARE  Rinse your mouth (gargle) with warm salt water (1 teaspoon salt in 1 cup of water). Do this 3 to 4  times per day or as needed for comfort.  Family members with a sore throat or fever should see a doctor.  Make sure everyone in your house washes their hands well.  Do not share food, drinking cups, or personal items.  Eat soft foods until your sore throat gets better.  Drink enough water and fluids to keep your pee (urine) clear or pale yellow.  Rest.  Stay home from school, daycare, or work until you have taken medicine for 24 hours.  Only take medicine as told by your doctor.  Take your medicine as told. Finish it even if you start to feel better. GET HELP RIGHT AWAY IF:   You have new problems, such as throwing up (vomiting) or bad headaches.  You have a stiff or painful neck, chest pain, trouble breathing, or trouble swallowing.  You have very bad throat pain, drooling, or changes in your voice.  Your neck puffs up (swells) or gets red and tender.  You have a fever.  You are very tired, your mouth is dry, or you are peeing less than normal.  You cannot wake up completely.  You get a rash, cough, or earache.  You have green, yellow-brown, or bloody spit.  Your pain does not get better with medicine. MAKE SURE YOU:   Understand these instructions.  Will watch your condition.  Will get help right away if  you are not doing well or get worse. Document Released: 03/30/2008 Document Revised: 01/04/2012 Document Reviewed: 12/11/2010 [facility] Patient Information 2015 [place]. This information is not intended to replace advice given to you by your health care provider. Make sure you discuss any questions you have with your health care provider.

## 2015-08-15 ENCOUNTER — Emergency Department
Admission: EM | Admit: 2015-08-15 | Discharge: 2015-08-15 | Disposition: A | Discharge status: Home / Self Care | Payer: Self-pay | Attending: Emergency Medicine | Admitting: Emergency Medicine

## 2015-08-15 ENCOUNTER — Encounter: Payer: Self-pay | Admitting: *Deleted

## 2015-08-15 DIAGNOSIS — J029 Acute pharyngitis, unspecified: Secondary | ICD-10-CM | POA: Insufficient documentation

## 2015-08-15 DIAGNOSIS — Z91040 Latex allergy status: Secondary | ICD-10-CM | POA: Insufficient documentation

## 2015-08-15 DIAGNOSIS — Z792 Long term (current) use of antibiotics: Secondary | ICD-10-CM | POA: Insufficient documentation

## 2015-08-15 DIAGNOSIS — Z79899 Other long term (current) drug therapy: Secondary | ICD-10-CM | POA: Insufficient documentation

## 2015-08-15 DIAGNOSIS — Z720 Tobacco use: Secondary | ICD-10-CM | POA: Insufficient documentation

## 2015-08-15 LAB — POCT RAPID STREP A: Streptococcus, Group A Screen (Direct): NEGATIVE

## 2015-08-15 NOTE — ED Notes (Signed)
Pt has a sore throat for 5 days.  No fever.  Pt has sinus drainage.

## 2015-08-15 NOTE — ED Notes (Signed)
poct strep Negative.  

## 2015-08-15 NOTE — Discharge Instructions (Signed)

## 2015-08-15 NOTE — ED Provider Notes (Signed)
[facility]  Time seen: 3:08 AM  I have reviewed the triage vital signs and the nursing notes.   HISTORY  Chief Complaint Sore Throat    HPI Crystal Riley is a 34 y.o. female with a past medical history of endometriosis who presents the emergency department with a sore throat. According to the patient around lunchtime today she developed a sore throat, states it has progressively worsened. It is now painful for her to talk or swallow. Denies any trouble swallowing or breathing but states it does hurt to swallow. Denies fever. Denies nausea or vomiting.Describes her sore throat as dull/aching, moderate pain.     Past Medical History  Diagnosis Date  . Medical history non-contributory   . Endometriosis     There are no active problems to display for this patient.   Past Surgical History  Procedure Laterality Date  . Laparoscopy    . Tonsillectomy      Current Outpatient Rx  Name  Route  Sig  Dispense  Refill  . amoxicillin (AMOXIL) 875 MG tablet   Oral   Take 1 tablet (875 mg total) by mouth 2 (two) times daily.   20 tablet   0   . Artificial Tear Solution (SYSTANE CONTACTS) SOLN   Ophthalmic   Apply 1-2 drops to eye as needed (For dry eyes and allergies.).         Marland Kitchen. doxycycline (DORYX) 100 MG EC tablet   Oral   Take 100 mg by mouth 2 (two) times daily.         . fluconazole (DIFLUCAN) 150 MG tablet   Oral   Take 1 tablet (150 mg total) by mouth daily.   1 tablet   0   . HYDROcodone-acetaminophen (VICODIN) 5-500 MG per tablet   Oral   Take 1 tablet by mouth every 4 (four) hours as needed for pain.   10 tablet   0   . ibuprofen (ADVIL,MOTRIN) 600 MG tablet   Oral   Take 1 tablet (600 mg total) by mouth every 8 (eight) hours as needed for mild pain or moderate pain.   15 tablet   0   . oxyCODONE-acetaminophen (PERCOCET/ROXICET) 5-325 MG per tablet   Oral   Take 2 tablets by mouth  stat.   15 tablet   0     Allergies Bactrim; Latex; and Tylenol with codeine #3  Family History  Problem Relation Age of Onset  . Diabetes Mother   . Hypertension Mother   . Pancreatitis Mother   . Endometriosis Mother   . Cancer Paternal Grandfather     Social History Social History  Substance Use Topics  . Smoking status: Current Every Day Smoker -- 1.00 packs/day    Types: Cigarettes, Cigars  . Smokeless tobacco: None  . Alcohol Use: 1.1 oz/week    1 Glasses of wine, 1 Standard drinks or equivalent per week    Review of Systems Constitutional: Negative for fever. ENT: Positive sore throat Cardiovascular: Negative for chest pain. Respiratory: Negative for shortness of breath. Negative cough Gastrointestinal: Negative for abdominal pain Genitourinary: Negative for dysuria. Neurological: Negative for headache. Negative neck pain. 10-point ROS otherwise negative.  ____________________________________________   PHYSICAL EXAM:  VITAL SIGNS: ED Triage Vitals  Enc Vitals Group     BP 08/15/15 0135 121/81 mmHg     Pulse Rate 08/15/15 0135 87     Resp 08/15/15 0135 16     Temp 08/15/15  0135 98.1 F (36.7 C)     Temp Source 08/15/15 0135 Oral     SpO2 08/15/15 0135 99 %     Weight 08/15/15 0135 131 lb (59.421 kg)     Height 08/15/15 0135  (1.549 m)     Head Cir --      Peak Flow --      Pain Score 08/15/15 0208 5     Pain Loc --      Pain Edu? --      Excl. in GC? --     Constitutional: Alert and oriented. Well appearing and in no distress. Eyes: Normal exam ENT   Head: Normocephalic and atraumatic.   Mouth/Throat: Mucous membranes are moist. Moderate pharyngeal erythema, no tonsillar hypertrophy, exudate, or uvular deviation noted. No sign of abscess. No tenderness to tracheal rock. Moderate anterior cervical lymphadenopathy. Cardiovascular: Normal rate, regular rhythm. No murmur Respiratory: Normal respiratory effort without tachypnea nor  retractions. Breath sounds are clear  Gastrointestinal: Soft and nontender. No distention.   Musculoskeletal: Nontender with normal range of motion in all extremities. Neurologic:  Normal speech and language. No gross focal neurologic deficits  Psychiatric: Mood and affect are normal. Speech and behavior are normal. Patient exhibits appropriate insight and judgment.  ____________________________________________    INITIAL IMPRESSION / ASSESSMENT AND PLAN / ED COURSE  Pertinent labs & imaging results that were available during my care of the patient were reviewed by me and considered in my medical decision making (see chart for details).  Strep negative. Exam most consistent with pharyngitis. We'll discharge home with supportive care, Tylenol/Motrin and Chloraseptic. I discussed this with the patient she is agreeable to the plan.  ____________________________________________   FINAL CLINICAL IMPRESSION(S) / ED DIAGNOSES  Pharyngitis   Minna Antis, MD 08/15/15 [id number]

## 2015-10-21 ENCOUNTER — Emergency Department
Admission: EM | Admit: 2015-10-21 | Discharge: 2015-10-21 | Disposition: A | Discharge status: Home / Self Care | Payer: Self-pay | Attending: Emergency Medicine | Admitting: Emergency Medicine

## 2015-10-21 ENCOUNTER — Emergency Department: Payer: Self-pay

## 2015-10-21 DIAGNOSIS — Z87891 Personal history of nicotine dependence: Secondary | ICD-10-CM | POA: Insufficient documentation

## 2015-10-21 DIAGNOSIS — R109 Unspecified abdominal pain: Secondary | ICD-10-CM

## 2015-10-21 DIAGNOSIS — Z79899 Other long term (current) drug therapy: Secondary | ICD-10-CM | POA: Insufficient documentation

## 2015-10-21 DIAGNOSIS — Z792 Long term (current) use of antibiotics: Secondary | ICD-10-CM | POA: Insufficient documentation

## 2015-10-21 DIAGNOSIS — Z91040 Latex allergy status: Secondary | ICD-10-CM | POA: Insufficient documentation

## 2015-10-21 DIAGNOSIS — N200 Calculus of kidney: Secondary | ICD-10-CM | POA: Insufficient documentation

## 2015-10-21 DIAGNOSIS — Z3202 Encounter for pregnancy test, result negative: Secondary | ICD-10-CM | POA: Insufficient documentation

## 2015-10-21 DIAGNOSIS — R102 Pelvic and perineal pain: Secondary | ICD-10-CM

## 2015-10-21 HISTORY — DX: Unspecified ovarian cyst, unspecified side: N83.209

## 2015-10-21 HISTORY — DX: Calculus of kidney: N20.0

## 2015-10-21 LAB — URINALYSIS COMPLETE WITH MICROSCOPIC (ARMC ONLY)
Bacteria, UA: NONE SEEN
Bilirubin Urine: NEGATIVE
Glucose, UA: NEGATIVE mg/dL
Hgb urine dipstick: NEGATIVE
Ketones, ur: NEGATIVE mg/dL
Leukocytes, UA: NEGATIVE
Nitrite: NEGATIVE
Protein, ur: NEGATIVE mg/dL
Specific Gravity, Urine: 1.02 (ref 1.005–1.030)
pH: 7 (ref 5.0–8.0)

## 2015-10-21 LAB — CBC
HCT: 39.3 % (ref 35.0–47.0)
Hemoglobin: 13.2 g/dL (ref 12.0–16.0)
MCH: 29.3 pg (ref 26.0–34.0)
MCHC: 33.7 g/dL (ref 32.0–36.0)
MCV: 87.2 fL (ref 80.0–100.0)
Platelets: 265 10*3/uL (ref 150–440)
RBC: 4.51 MIL/uL (ref 3.80–5.20)
RDW: 12.8 % (ref 11.5–14.5)
WBC: 9.1 10*3/uL (ref 3.6–11.0)

## 2015-10-21 LAB — COMPREHENSIVE METABOLIC PANEL
ALT: 11 U/L — ABNORMAL LOW (ref 14–54)
AST: 18 U/L (ref 15–41)
Albumin: 4.3 g/dL (ref 3.5–5.0)
Alkaline Phosphatase: 57 U/L (ref 38–126)
Anion gap: 5 (ref 5–15)
BUN: 19 mg/dL (ref 6–20)
CO2: 26 mmol/L (ref 22–32)
Calcium: 9.1 mg/dL (ref 8.9–10.3)
Chloride: 106 mmol/L (ref 101–111)
Creatinine, Ser: 0.59 mg/dL (ref 0.44–1.00)
GFR calc Af Amer: >60 mL/min (ref >60)
GFR calc non Af Amer: >60 mL/min (ref >60)
Glucose, Bld: 123 mg/dL — ABNORMAL HIGH (ref 65–99)
Potassium: 4 mmol/L (ref 3.5–5.1)
Sodium: 137 mmol/L (ref 135–145)
Total Bilirubin: 0.5 mg/dL (ref 0.3–1.2)
Total Protein: 7 g/dL (ref 6.5–8.1)

## 2015-10-21 LAB — LIPASE, BLOOD: Lipase: 20 U/L (ref 11–51)

## 2015-10-21 LAB — POCT PREGNANCY, URINE: Preg Test, Ur: NEGATIVE

## 2015-10-21 MED ORDER — ONDANSETRON 4 MG PO TBDP
4.0000 mg | ORAL_TABLET | Freq: Once | ORAL | Status: AC
  Administered 2015-10-21: 4 mg via ORAL

## 2015-10-21 MED ORDER — KETOROLAC TROMETHAMINE 10 MG PO TABS
10.0000 mg | ORAL_TABLET | Freq: Once | ORAL | Status: AC
  Administered 2015-10-21: 10 mg via ORAL

## 2015-10-21 MED ORDER — KETOROLAC TROMETHAMINE 10 MG PO TABS: 10.0000 mg | ORAL_TABLET | Freq: Three times a day (TID) | ORAL | Status: DC | PRN

## 2015-10-21 MED ORDER — KETOROLAC TROMETHAMINE 10 MG PO TABS
ORAL_TABLET | ORAL | Status: AC
  Administered 2015-10-21: 10 mg via ORAL
  Filled 2015-10-21: qty 1

## 2015-10-21 NOTE — ED Notes (Signed)
Pt c/o LLQ pain that radiates into the back with nausea and loose stools.. States she has a hx of kidney stones but not sure if this is the same pain.

## 2015-10-21 NOTE — ED Notes (Signed)
Pt states left sided flank pain radiating to her back, states some nausea, hx of kidney stones

## 2015-10-21 NOTE — Discharge Instructions (Signed)
Kidney Stones Kidney stones (urolithiasis) are deposits that form inside your kidneys. The intense pain is caused by the stone moving through the urinary tract. When the stone moves, the ureter goes into spasm around the stone. The stone is usually passed in the urine.  CAUSES   A disorder that makes certain neck glands produce too much parathyroid hormone (primary hyperparathyroidism).  A buildup of uric acid crystals, similar to gout in your joints.  Narrowing (stricture) of the ureter.  A kidney obstruction present at birth (congenital obstruction).  Previous surgery on the kidney or ureters.  Numerous kidney infections. SYMPTOMS   Feeling sick to your stomach (nauseous).  Throwing up (vomiting).  Blood in the urine (hematuria).  Pain that usually spreads (radiates) to the groin.  Frequency or urgency of urination. DIAGNOSIS   Taking a history and physical exam.  Blood or urine tests.  CT scan.  Occasionally, an examination of the inside of the urinary bladder (cystoscopy) is performed. TREATMENT   Observation.  Increasing your fluid intake.  Extracorporeal shock wave lithotripsy--This is a noninvasive procedure that uses shock waves to break up kidney stones.  Surgery may be needed if you have severe pain or persistent obstruction. There are various surgical procedures. Most of the procedures are performed with the use of small instruments. Only small incisions are needed to accommodate these instruments, so recovery time is minimized. The size, location, and chemical composition are all important variables that will determine the proper choice of action for you. Talk to your health care provider to better understand your situation so that you will minimize the risk of injury to yourself and your kidney.  HOME CARE INSTRUCTIONS   Drink enough water and fluids to keep your urine clear or pale yellow. This will help you to pass the stone or stone fragments.  Strain  all urine through the provided strainer. Keep all particulate matter and stones for your health care provider to see. The stone causing the pain may be as small as a grain of salt. It is very important to use the strainer each and every time you pass your urine. The collection of your stone will allow your health care provider to analyze it and verify that a stone has actually passed. The stone analysis will often identify what you can do to reduce the incidence of recurrences.  Only take over-the-counter or prescription medicines for pain, discomfort, or fever as directed by your health care provider.  Keep all follow-up visits as told by your health care provider. This is important.  Get follow-up X-rays if required. The absence of pain does not always mean that the stone has passed. It may have only stopped moving. If the urine remains completely obstructed, it can cause loss of kidney function or even complete destruction of the kidney. It is your responsibility to make sure X-rays and follow-ups are completed. Ultrasounds of the kidney can show blockages and the status of the kidney. Ultrasounds are not associated with any radiation and can be performed easily in a matter of minutes.  Make changes to your daily diet as told by your health care provider. You may be told to:  Limit the amount of salt that you eat.  Eat 5 or more servings of fruits and vegetables each day.  Limit the amount of meat, poultry, fish, and eggs that you eat.  Collect a 24-hour urine sample as told by your health care provider.You may need to collect another urine sample every 6-12  months. SEEK MEDICAL CARE IF:  You experience pain that is progressive and unresponsive to any pain medicine you have been prescribed. SEEK IMMEDIATE MEDICAL CARE IF:   Pain cannot be controlled with the prescribed medicine.  You have a fever or shaking chills.  The severity or intensity of pain increases over 18 hours and is not  relieved by pain medicine.  You develop a new onset of abdominal pain.  You feel faint or pass out.  You are unable to urinate.   This information is not intended to replace advice given to you by your health care provider. Make sure you discuss any questions you have with your health care provider.   Document Released: 10/12/2005 Document Revised: 07/03/2015 Document Reviewed: 03/15/2013 Elsevier Interactive Patient Education 2016 [employer]  Pelvic Pain, Female Female pelvic pain can be caused by many different things and start from a variety of places. Pelvic pain refers to pain that is located in the lower half of the abdomen and between your hips. The pain may occur over a short period of time (acute) or may be reoccurring (chronic). The cause of pelvic pain may be related to disorders affecting the female reproductive organs (gynecologic), but it may also be related to the bladder, kidney stones, an intestinal complication, or muscle or skeletal problems. Getting help right away for pelvic pain is important, especially if there has been severe, sharp, or a sudden onset of unusual pain. It is also important to get help right away because some types of pelvic pain can be life threatening.  CAUSES  Below are only some of the causes of pelvic pain. The causes of pelvic pain can be in one of several categories.   Gynecologic.  Pelvic inflammatory disease.  Sexually transmitted infection.  Ovarian cyst or a twisted ovarian ligament (ovarian torsion).  Uterine lining that grows outside the uterus (endometriosis).  Fibroids, cysts, or tumors.  Ovulation.  Pregnancy.  Pregnancy that occurs outside the uterus (ectopic pregnancy).  Miscarriage.  Labor.  Abruption of the placenta or ruptured uterus.  Infection.  Uterine infection (endometritis).  Bladder infection.  Diverticulitis.  Miscarriage related to a uterine infection (septic  abortion).  Bladder.  Inflammation of the bladder (cystitis).  Kidney stone(s).  Gastrointestinal.  Constipation.  Diverticulitis.  Neurologic.  Trauma.  Feeling pelvic pain because of mental or emotional causes (psychosomatic).  Cancers of the bowel or pelvis. EVALUATION  Your caregiver will want to take a careful history of your concerns. This includes recent changes in your health, a careful gynecologic history of your periods (menses), and a sexual history. Obtaining your family history and medical history is also important. Your caregiver may suggest a pelvic exam. A pelvic exam will help identify the location and severity of the pain. It also helps in the evaluation of which organ system may be involved. In order to identify the cause of the pelvic pain and be properly treated, your caregiver may order tests. These tests may include:   A pregnancy test.  Pelvic ultrasonography.  An X-ray exam of the abdomen.  A urinalysis or evaluation of vaginal discharge.  Blood tests. HOME CARE INSTRUCTIONS   Only take over-the-counter or prescription medicines for pain, discomfort, or fever as directed by your caregiver.   Rest as directed by your caregiver.   Eat a balanced diet.   Drink enough fluids to make your urine clear or pale yellow, or as directed.   Avoid sexual intercourse if it causes pain.  Apply warm or cold compresses to the lower abdomen depending on which one helps the pain.   Avoid stressful situations.   Keep a journal of your pelvic pain. Write down when it started, where the pain is located, and if there are things that seem to be associated with the pain, such as food or your menstrual cycle.  Follow up with your caregiver as directed.  SEEK MEDICAL CARE IF:  Your medicine does not help your pain.  You have abnormal vaginal discharge. SEEK IMMEDIATE MEDICAL CARE IF:   You have heavy bleeding from the vagina.   Your pelvic pain  increases.   You feel light-headed or faint.   You have chills.   You have pain with urination or blood in your urine.   You have uncontrolled diarrhea or vomiting.   You have a fever or persistent symptoms for more than 3 days.  You have a fever and your symptoms suddenly get worse.   You are being physically or sexually abused.   This information is not intended to replace advice given to you by your health care provider. Make sure you discuss any questions you have with your health care provider.   Document Released: 09/08/2004 Document Revised: 07/03/2015 Document Reviewed: 02/01/2012 Elsevier Interactive Patient Education [employer]

## 2015-10-21 NOTE — ED Provider Notes (Signed)
[facility] Emergency Department Provider Note  ____________________________________________  Time seen: 5:30 PM  I have reviewed the triage vital signs and the nursing notes.   HISTORY  Chief Complaint Abdominal Pain      HPI Crystal Riley is a 34 y.o. female presents with left flank pain 3 days accompanied by nausea. Patient admits to history of kidney stones in the past. Patient denies any dysuria no hematuria no urgency or frequency. Patient admits to nausea but no vomiting.     Past Medical History  Diagnosis Date  . Medical history non-contributory   . Endometriosis   . Kidney stone   . Ovarian cyst     There are no active problems to display for this patient.   Past Surgical History  Procedure Laterality Date  . Laparoscopy    . Tonsillectomy      Current Outpatient Rx  Name  Route  Sig  Dispense  Refill  . amoxicillin (AMOXIL) 875 MG tablet   Oral   Take 1 tablet (875 mg total) by mouth 2 (two) times daily.   20 tablet   0   . Artificial Tear Solution (SYSTANE CONTACTS) SOLN   Ophthalmic   Apply 1-2 drops to eye as needed (For dry eyes and allergies.).         Marland Kitchen. doxycycline (DORYX) 100 MG EC tablet   Oral   Take 100 mg by mouth 2 (two) times daily.         . fluconazole (DIFLUCAN) 150 MG tablet   Oral   Take 1 tablet (150 mg total) by mouth daily.   1 tablet   0   . HYDROcodone-acetaminophen (VICODIN) 5-500 MG per tablet   Oral   Take 1 tablet by mouth every 4 (four) hours as needed for pain.   10 tablet   0   . ibuprofen (ADVIL,MOTRIN) 600 MG tablet   Oral   Take 1 tablet (600 mg total) by mouth every 8 (eight) hours as needed for mild pain or moderate pain.   15 tablet   0   . oxyCODONE-acetaminophen (PERCOCET/ROXICET) 5-325 MG per tablet   Oral   Take 2 tablets by mouth stat.   15 tablet   0     Allergies Bactrim; Latex; and Tylenol with codeine #3  Family History  Problem Relation Age of  Onset  . Diabetes Mother   . Hypertension Mother   . Pancreatitis Mother   . Endometriosis Mother   . Cancer Paternal Grandfather     Social History Social History  Substance Use Topics  . Smoking status: Former Smoker -- 1.00 packs/day    Types: Cigarettes, Cigars  . Smokeless tobacco: None  . Alcohol Use: 1.1 oz/week    1 Glasses of wine, 1 Standard drinks or equivalent per week    Review of Systems  Constitutional: Negative for fever. Eyes: Negative for visual changes. ENT: Negative for sore throat. Cardiovascular: Negative for chest pain. Respiratory: Negative for shortness of breath. Gastrointestinal: Positive for abdominal pain Genitourinary: Negative for dysuria. Musculoskeletal: Negative for back pain. Skin: Negative for rash. Neurological: Negative for headaches, focal weakness or numbness.   10-point ROS otherwise negative.  ____________________________________________   PHYSICAL EXAM:  VITAL SIGNS: ED Triage Vitals  Enc Vitals Group     BP 10/21/15 1613 114/69 mmHg     Pulse Rate 10/21/15 1613 89     Resp 10/21/15 1613 18     Temp 10/21/15 1613 98.2 F (  36.8 C)     Temp Source 10/21/15 1613 Oral     SpO2 10/21/15 1613 98 %     Weight 10/21/15 1613 132 lb (59.875 kg)     Height 10/21/15 1613  (1.549 m)     Head Cir --      Peak Flow --      Pain Score 10/21/15 1613 8     Pain Loc --      Pain Edu? --      Excl. in GC? --      Constitutional: Alert and oriented. Well appearing and in no distress. Eyes: Conjunctivae are normal. PERRL. Normal extraocular movements. ENT   Head: Normocephalic and atraumatic.   Nose: No congestion/rhinnorhea.   Mouth/Throat: Mucous membranes are moist.   Neck: No stridor. Hematological/Lymphatic/Immunilogical: No cervical lymphadenopathy. Cardiovascular: Normal rate, regular rhythm. Normal and symmetric distal pulses are present in all extremities. No murmurs, rubs, or gallops. Respiratory:  Normal respiratory effort without tachypnea nor retractions. Breath sounds are clear and equal bilaterally. No wheezes/rales/rhonchi. Gastrointestinal: Soft and nontender. No distention. There is no CVA tenderness. Genitourinary: deferred Musculoskeletal: Nontender with normal range of motion in all extremities. No joint effusions.  No lower extremity tenderness nor edema. Neurologic:  Normal speech and language. No gross focal neurologic deficits are appreciated. Speech is normal.  Skin:  Skin is warm, dry and intact. No rash noted. Psychiatric: Mood and affect are normal. Speech and behavior are normal. Patient exhibits appropriate insight and judgment.  ____________________________________________    LABS (pertinent positives/negatives)  Labs Reviewed  COMPREHENSIVE METABOLIC PANEL - Abnormal; Notable for the following:    Glucose, Bld 123 (*)    ALT 11 (*)    All other components within normal limits  URINALYSIS COMPLETEWITH MICROSCOPIC ([facility] ONLY) - Abnormal; Notable for the following:    Color, Urine YELLOW (*)    APPearance CLEAR (*)    Squamous Epithelial / LPF 0-5 (*)    All other components within normal limits  LIPASE, BLOOD  CBC  POCT PREGNANCY, URINE       RADIOLOGY  CT RENAL STONE STUDY (Final result) Result time: 10/21/15 18:47:11   Final result by Rad Results In Interface (10/21/15 18:47:11)   Narrative:   CLINICAL DATA: LEFT lower quadrant pain the radiates the back. Nausea and loose stools.  EXAM: CT ABDOMEN AND PELVIS WITHOUT CONTRAST  TECHNIQUE: Multidetector CT imaging of the abdomen and pelvis was performed following the standard protocol without IV contrast.  COMPARISON: CT 12/30/2004  FINDINGS: Lower chest: Lung bases are clear.  Hepatobiliary: No focal hepatic lesion. No biliary duct dilatation. Gallbladder is normal. Common bile duct is normal.  Pancreas: Pancreas is normal. No ductal dilatation. No  pancreatic inflammation.  Spleen: Normal spleen  Adrenals/urinary tract: Adrenal glands are normal.  There 9 nonobstructing calculi in the RIGHT kidney ranging size from 2-4 mm. There are 4 nonobstructing calculi of the LEFT kidney ranging size from 2-4 mm. No ureterolithiasis on the LEFT or RIGHT. No obstructive uropathy. No bladder calculi.  Stomach/Bowel: Stomach, small bowel, appendix, and cecum are normal. The colon and rectosigmoid colon are normal.  Vascular/Lymphatic: Abdominal aorta is normal caliber. There is no retroperitoneal or periportal lymphadenopathy. No pelvic lymphadenopathy.  Reproductive: Uterus and ovaries are normal. IUD in expected location Trace free fluid the pelvis.  Other: Trace free fluid.  Musculoskeletal: No aggressive osseous lesion.  IMPRESSION: 1. Bilateral nephrolithiasis. No ureterolithiasis or obstructive uropathy. 2. Normal appendix. 3. IUD in expected  location in the uterus. 4. Ovaries appear normal. 5. Small amount free fluid is likely physiologic. 6. No abnormality of the bowel.   Electronically Signed By: Genevive Bi M.D. On: 10/21/2015 18:47         INITIAL IMPRESSION / ASSESSMENT AND PLAN / ED COURSE  Pertinent labs & imaging results that were available during my care of the patient were reviewed by me and considered in my medical decision making (see chart for details).  Patient informed of all clinical findings including that of the CAT scan.  patient's flank pain however patient does have bilateral nephrolithiasis.  ____________________________________________   FINAL CLINICAL IMPRESSION(S) / ED DIAGNOSES  Final diagnoses:  Left flank pain  Kidney stones  Pelvic pain in female      Darci Current, MD 10/21/15 [phone]

## 2016-03-07 ENCOUNTER — Encounter: Payer: Self-pay | Admitting: Emergency Medicine

## 2016-03-07 ENCOUNTER — Emergency Department
Admission: EM | Admit: 2016-03-07 | Discharge: 2016-03-07 | Disposition: A | Discharge status: Home / Self Care | Payer: Self-pay | Attending: Emergency Medicine | Admitting: Emergency Medicine

## 2016-03-07 DIAGNOSIS — Z791 Long term (current) use of non-steroidal anti-inflammatories (NSAID): Secondary | ICD-10-CM | POA: Insufficient documentation

## 2016-03-07 DIAGNOSIS — Z87891 Personal history of nicotine dependence: Secondary | ICD-10-CM | POA: Insufficient documentation

## 2016-03-07 DIAGNOSIS — K047 Periapical abscess without sinus: Secondary | ICD-10-CM | POA: Insufficient documentation

## 2016-03-07 DIAGNOSIS — F1290 Cannabis use, unspecified, uncomplicated: Secondary | ICD-10-CM | POA: Insufficient documentation

## 2016-03-07 MED ORDER — AMOXICILLIN 500 MG PO CAPS: 500.0000 mg | ORAL_CAPSULE | Freq: Three times a day (TID) | ORAL | Status: AC

## 2016-03-07 MED ORDER — LIDOCAINE VISCOUS 2 % MT SOLN: OROMUCOSAL | Status: DC

## 2016-03-07 MED ORDER — FLUCONAZOLE 150 MG PO TABS: 150.0000 mg | ORAL_TABLET | Freq: Every day | ORAL | Status: DC

## 2016-03-07 NOTE — ED Notes (Signed)
Kara MeadEmma PA at bedside

## 2016-03-07 NOTE — ED Notes (Signed)
C/o left lower wisdom tooth pain x 1 day.

## 2016-03-07 NOTE — ED Notes (Signed)
Pt verbalized understanding of discharge instructions. NAD at this time. 

## 2016-03-07 NOTE — ED Provider Notes (Signed)
CSN: [id number]     Arrival date & time 03/07/16  1104 History   First MD Initiated Contact with Patient 03/07/16 1116     Chief Complaint  Patient presents with  . Dental Pain      HPI Comments: 35 year old female presents today complaining of left lower dental pain. Pt reports she knows she has a bad wisdom tooth and needs it extracted. Has been set up with [facility] and is awaiting an appointment with them for the procedure. Last evening started to develop worsening pain, took oxycodone she had from a previous prescription with some relief. No fevers, sweats or chills. Has had some drainage from the gum surrounding tooth.   The history is provided by the patient.    Past Medical History  Diagnosis Date  . Medical history non-contributory   . Endometriosis   . Kidney stone   . Ovarian cyst    Past Surgical History  Procedure Laterality Date  . Laparoscopy    . Tonsillectomy     Family History  Problem Relation Age of Onset  . Diabetes Mother   . Hypertension Mother   . Pancreatitis Mother   . Endometriosis Mother   . Cancer Paternal Grandfather    Social History  Substance Use Topics  . Smoking status: Former Smoker -- 1.00 packs/day    Types: Cigarettes, Cigars  . Smokeless tobacco: None  . Alcohol Use: 1.1 oz/week    1 Glasses of wine, 1 Standard drinks or equivalent per week   OB History    Gravida Para Term Preterm AB TAB SAB Ectopic Multiple Living   0     Review of Systems  Constitutional: Negative for fever and chills.  HENT: Positive for dental problem.   All other systems reviewed and are negative.     Allergies  Bactrim; Latex; and Tylenol with codeine #3  Home Medications   Prior to Admission medications   Medication Sig Start Date End Date Taking? Authorizing Provider  amoxicillin (AMOXIL) 500 MG capsule Take 1 capsule (500 mg total) by mouth 3 (three) times daily. 03/07/16 03/14/16  Christella Scheuermann, PA-C  fluconazole  (DIFLUCAN) 150 MG tablet Take 1 tablet (150 mg total) by mouth daily. 03/07/16   Christella Scheuermann, PA-C  ibuprofen (ADVIL,MOTRIN) 600 MG tablet Take 1 tablet (600 mg total) by mouth every 8 (eight) hours as needed for mild pain or moderate pain. 04/30/15   Renford Dills, NP  lidocaine (XYLOCAINE) 2 % solution Swish and spit 5 ml TID PRN dental pain 03/07/16   Christella Scheuermann, PA-C   BP 118/74 mmHg  Pulse 90  Temp(Src) 97.9 F (36.6 C) (Oral)  Resp 16  Ht  (1.549 m)  Wt 64.864 kg  BMI 27.03 kg/m2  SpO2 100% Physical Exam  Constitutional: Vital signs are normal. She appears well-developed and well-nourished. She is active.  Non-toxic appearance. She does not have a sickly appearance. She does not appear ill.  HENT:  Head: Normocephalic and atraumatic.  Right Ear: Tympanic membrane and external ear normal.  Left Ear: Tympanic membrane and external ear normal.  Nose: Nose normal.  Mouth/Throat: Uvula is midline, oropharynx is clear and moist and mucous membranes are normal. Dental abscesses and dental caries present.  Diffuse dental caries. Left lower wisdom tooth has gum swelling surrounding the tooth, tender to palpation. No focal abscess   Neck: Trachea normal, normal range of motion,  full passive range of motion without pain and phonation normal. Neck supple.  Neurological: She is alert.  Psychiatric: She has a normal mood and affect. Her speech is normal.  Nursing note and vitals reviewed.   ED Course  Procedures (including critical care time) Labs Review Labs Reviewed - No data to display  Imaging Review No results found. I have personally reviewed and evaluated these images and lab results as part of my medical decision-making.   EKG Interpretation None      MDM  Amoxicillin 500mg  three time per day for a week, Diflucan 150mg  one time dose for yeast prophylaxis  Viscous lidocaine as needed 3 times per day for pain [facility],return  here for any new or worsening symptoms Final diagnoses:  Dental abscess        Christella Scheuermannmma Marsalis Beaulieu V, PA-C 03/07/16 1131  Jeanmarie PlantJames A McShane, MD 03/07/16 1610

## 2016-03-07 NOTE — ED Notes (Signed)
Reports toothache on right side, mild swelling noted.  No resp distress.

## 2016-03-07 NOTE — Discharge Instructions (Signed)

## 2016-06-10 ENCOUNTER — Encounter: Payer: Self-pay | Admitting: Emergency Medicine

## 2016-06-10 ENCOUNTER — Emergency Department
Admission: EM | Admit: 2016-06-10 | Discharge: 2016-06-10 | Disposition: A | Discharge status: Home / Self Care | Payer: Self-pay | Attending: Emergency Medicine | Admitting: Emergency Medicine

## 2016-06-10 DIAGNOSIS — Z87891 Personal history of nicotine dependence: Secondary | ICD-10-CM | POA: Insufficient documentation

## 2016-06-10 DIAGNOSIS — L02412 Cutaneous abscess of left axilla: Secondary | ICD-10-CM | POA: Insufficient documentation

## 2016-06-10 DIAGNOSIS — F1290 Cannabis use, unspecified, uncomplicated: Secondary | ICD-10-CM | POA: Insufficient documentation

## 2016-06-10 DIAGNOSIS — Z91040 Latex allergy status: Secondary | ICD-10-CM | POA: Insufficient documentation

## 2016-06-10 MED ORDER — FLUCONAZOLE 100 MG PO TABS: 100.0000 mg | ORAL_TABLET | Freq: Every day | ORAL | 0 refills | Status: DC

## 2016-06-10 MED ORDER — IBUPROFEN 600 MG PO TABS: 600.0000 mg | ORAL_TABLET | Freq: Four times a day (QID) | ORAL | 0 refills | Status: DC | PRN

## 2016-06-10 MED ORDER — CEPHALEXIN 500 MG PO CAPS: 500.0000 mg | ORAL_CAPSULE | Freq: Three times a day (TID) | ORAL | 0 refills | Status: DC

## 2016-06-10 NOTE — ED Notes (Signed)
See triage note  Possible abscess area under left arm

## 2016-06-10 NOTE — ED Provider Notes (Signed)
[facility] Emergency Department Provider Note  ____________________________________________  Time seen: Approximately 4:18 PM  I have reviewed the triage vital signs and the nursing notes.   HISTORY  Chief Complaint Abscess    HPI Crystal Riley is a 35 y.o. female, NAD, presents to the emergency department with 3 week history of skin sore in her left axilla. Area became painful over the past few days. She has tried applying warm heat to the affected area but it has not come to a head. Notes mild pain with movement of the left arm. Patient has had an abscess in the past which had to be drained. Patient states that she is experiencing 5/10 pain. Denies fevers, chills, body aches, abdominal pain, nausea, vomiting, numbness or tingling. No oozing or weeping. Has not taken anything OTC for her symptoms. Patient does request fluconazole prescription if she is given any antibiotics.    Past Medical History:  Diagnosis Date  . Endometriosis   . Kidney stone   . Medical history non-contributory   . Ovarian cyst     There are no active problems to display for this patient.   Past Surgical History:  Procedure Laterality Date  . LAPAROSCOPY    . TONSILLECTOMY      Prior to Admission medications   Medication Sig Start Date End Date Taking? Authorizing Provider  cephALEXin (KEFLEX) 500 MG capsule Take 1 capsule (500 mg total) by mouth 3 (three) times daily. 06/10/16   Alfonsa Vaile L Raylin Diguglielmo, PA-C  fluconazole (DIFLUCAN) 100 MG tablet Take 1 tablet (100 mg total) by mouth daily. 06/10/16   Zosia Lucchese L Lessie Funderburke, PA-C  ibuprofen (ADVIL,MOTRIN) 600 MG tablet Take 1 tablet (600 mg total) by mouth every 6 (six) hours as needed. 06/10/16   Damonte Frieson L Regnald Bowens, PA-C    Allergies Bactrim [sulfamethoxazole-trimethoprim]; Latex; and Tylenol with codeine #3 [acetaminophen-codeine]  Family History  Problem Relation Age of Onset  . Diabetes Mother   . Hypertension Mother   . Pancreatitis  Mother   . Endometriosis Mother   . Cancer Paternal Grandfather     Social History Social History  Substance Use Topics  . Smoking status: Former Smoker    Packs/day: 1.00    Types: Cigarettes, Cigars  . Smokeless tobacco: Never Used  . Alcohol use 1.1 oz/week    1 Glasses of wine, 1 Standard drinks or equivalent per week     Review of Systems  Constitutional: No fever/chills, fatigue Cardiovascular: No chest pain. Respiratory: No shortness of breath.  Musculoskeletal: Negative for general myalgias.  Skin: Positive skin sore left axilla. Negative for rash, oozing, weeping, open wounds. Neurological: Negative for headaches, focal weakness or numbness. No tingling.  10-point ROS otherwise negative.  ____________________________________________   PHYSICAL EXAM:  VITAL SIGNS: ED Triage Vitals  Enc Vitals Group     BP 06/10/16 1554 119/72     Pulse Rate 06/10/16 1554 91     Resp 06/10/16 1554 18     Temp 06/10/16 1554 98.6 F (37 C)     Temp Source 06/10/16 1554 Oral     SpO2 06/10/16 1554 96 %     Weight 06/10/16 1556 124 lb (56.2 kg)     Height 06/10/16 1556 5\' 1"  (1.549 m)     Head Circumference --      Peak Flow --      Pain Score 06/10/16 1556 5     Pain Loc --      Pain Edu? --  Excl. in GC? --      Constitutional: Alert and oriented. Well appearing and in no acute distress. Eyes: Conjunctivae are normal. Head: Atraumatic. Neck: Supple with FROM.   Hematological/Lymphatic/Immunilogical: No cervical, axillary lymphadenopathy. Cardiovascular: Good peripheral circulation with 2+ pulses noted in the left upper extremity. Capillary refill is brisk in all digits of the left hand. Respiratory: Normal respiratory effort without tachypnea or retractions.  Neurologic:  Normal speech and language. No gross focal neurologic deficits are appreciated.  Skin:  Skin is warm, dry. 1cmX 0.3 cm indurated, tracely erythematous abscess noted in left axilla. Mild tenderness  to palpation. No weeping or oozing. No fluctuance.  Psychiatric: Mood and affect are normal. Speech and behavior are normal. Patient exhibits appropriate insight and judgement.   ____________________________________________   LABS  None ____________________________________________  EKG  None ____________________________________________  RADIOLOGY  None ____________________________________________    PROCEDURES  Procedure(s) performed: None   Procedures   Medications - No data to display   ____________________________________________   INITIAL IMPRESSION / ASSESSMENT AND PLAN / ED COURSE  Pertinent labs & imaging results that were available during my care of the patient were reviewed by me and considered in my medical decision making (see chart for details).  Clinical Course    Patient's diagnosis is consistent with abscess of left axilla. Patient will be discharged home with prescriptions for Bactrim DS, fluconazole and ibuprofen to take as directed. Patient is to continue warm compresses x 20 minutes 3-4 times daily. Patient is to follow up with [facility]CP at [facility] if symptoms persist past this treatment course. Patient is given ED precautions to return to the ED for any worsening or new symptoms.    ____________________________________________  FINAL CLINICAL IMPRESSION(S) / ED DIAGNOSES  Final diagnoses:  Abscess of axilla, left      NEW MEDICATIONS STARTED DURING THIS VISIT:  Discharge Medication List as of 06/10/2016  4:29 PM    START taking these medications   Details  cephALEXin (KEFLEX) 500 MG capsule Take 1 capsule (500 mg total) by mouth 3 (three) times daily., Starting Wed 06/10/2016, Print             Ernestene KielJami L KensingtonHagler, PA-C 06/10/16 1715    Arnaldo NatalPaul F Malinda, MD 06/11/16 [phone]

## 2016-06-10 NOTE — ED Triage Notes (Signed)
Patient presents to the ED with dime sized swollen area to left axillary area that patient states is painful.

## 2016-08-06 ENCOUNTER — Emergency Department
Admission: EM | Admit: 2016-08-06 | Discharge: 2016-08-06 | Disposition: A | Discharge status: Home / Self Care | Payer: Self-pay | Attending: Emergency Medicine | Admitting: Emergency Medicine

## 2016-08-06 ENCOUNTER — Encounter: Payer: Self-pay | Admitting: Emergency Medicine

## 2016-08-06 ENCOUNTER — Emergency Department: Payer: Self-pay

## 2016-08-06 DIAGNOSIS — J40 Bronchitis, not specified as acute or chronic: Secondary | ICD-10-CM | POA: Insufficient documentation

## 2016-08-06 DIAGNOSIS — R0789 Other chest pain: Secondary | ICD-10-CM

## 2016-08-06 DIAGNOSIS — Z87891 Personal history of nicotine dependence: Secondary | ICD-10-CM | POA: Insufficient documentation

## 2016-08-06 DIAGNOSIS — Z91040 Latex allergy status: Secondary | ICD-10-CM | POA: Insufficient documentation

## 2016-08-06 LAB — TROPONIN I: Troponin I: <0.03 ng/mL (ref <0.03)

## 2016-08-06 LAB — BASIC METABOLIC PANEL
Anion gap: 10 (ref 5–15)
BUN: 12 mg/dL (ref 6–20)
CO2: 23 mmol/L (ref 22–32)
Calcium: 9.2 mg/dL (ref 8.9–10.3)
Chloride: 105 mmol/L (ref 101–111)
Creatinine, Ser: 0.68 mg/dL (ref 0.44–1.00)
GFR calc Af Amer: >60 mL/min (ref >60)
GFR calc non Af Amer: >60 mL/min (ref >60)
Glucose, Bld: 98 mg/dL (ref 65–99)
Potassium: 3.8 mmol/L (ref 3.5–5.1)
Sodium: 138 mmol/L (ref 135–145)

## 2016-08-06 LAB — CBC
HCT: 40.4 % (ref 35.0–47.0)
Hemoglobin: 14.1 g/dL (ref 12.0–16.0)
MCH: 30.5 pg (ref 26.0–34.0)
MCHC: 35 g/dL (ref 32.0–36.0)
MCV: 87 fL (ref 80.0–100.0)
Platelets: 222 10*3/uL (ref 150–440)
RBC: 4.64 MIL/uL (ref 3.80–5.20)
RDW: 12.7 % (ref 11.5–14.5)
WBC: 10.7 10*3/uL (ref 3.6–11.0)

## 2016-08-06 MED ORDER — AZITHROMYCIN 250 MG PO TABS: ORAL_TABLET | ORAL | 0 refills | Status: AC

## 2016-08-06 MED ORDER — FLUCONAZOLE 150 MG PO TABS: 150.0000 mg | ORAL_TABLET | Freq: Once | ORAL | 0 refills | Status: AC

## 2016-08-06 MED ORDER — ALBUTEROL SULFATE HFA 108 (90 BASE) MCG/ACT IN AERS: 2.0000 | INHALATION_SPRAY | Freq: Four times a day (QID) | RESPIRATORY_TRACT | 0 refills | Status: DC | PRN

## 2016-08-06 MED ORDER — AZITHROMYCIN 250 MG PO TABS
500.0000 mg | ORAL_TABLET | Freq: Once | ORAL | Status: AC
  Administered 2016-08-06: 500 mg via ORAL
  Filled 2016-08-06: qty 2

## 2016-08-06 MED ORDER — ALBUTEROL SULFATE (2.5 MG/3ML) 0.083% IN NEBU
2.5000 mg | INHALATION_SOLUTION | Freq: Once | RESPIRATORY_TRACT | Status: AC
  Administered 2016-08-06: 2.5 mg via RESPIRATORY_TRACT
  Filled 2016-08-06 (×2): qty 3

## 2016-08-06 NOTE — ED Triage Notes (Signed)
Complains of sinus congestion for several weeks.  States last night couldn't sleep laying down due to painful cough.  2 hours ago c/o shooting right chest pain radiating down right arm, pain worse with moving, breathing, coughing.

## 2016-08-06 NOTE — ED Provider Notes (Signed)
[facility] Emergency Department Provider Note   ____________________________________________   None    (approximate)  I have reviewed the triage vital signs and the nursing notes.   HISTORY  Chief Complaint Cough and Chest Pain   HPI Crystal Riley is a 35 y.o. female with a history of kidney stones as well as ovarian cysts who is presenting to the emergency department today with right-sided chest pain. She says the pain started over the course of the past 1-2 days. She says that it worsens with coughing as well as movement of the right upper extremity. Does not report any pain with deep inspiration. Says that she has had a cough as well over the past 2 days with production of green sputum. Has had runny nose over the past 2 weeks. Denies any fever. Denies any sick contacts. Denies any sore throat. Denies any ear pressure. Denies smoking. Says that the pain is constant and cramping. Denies any nausea vomiting or diaphoresis.   Past Medical History:  Diagnosis Date  . Endometriosis   . Kidney stone   . Medical history non-contributory   . Ovarian cyst     There are no active problems to display for this patient.   Past Surgical History:  Procedure Laterality Date  . LAPAROSCOPY    . TONSILLECTOMY      Prior to Admission medications   Medication Sig Start Date End Date Taking? Authorizing Provider  cephALEXin (KEFLEX) 500 MG capsule Take 1 capsule (500 mg total) by mouth 3 (three) times daily. 06/10/16   Jami L Hagler, PA-C  fluconazole (DIFLUCAN) 100 MG tablet Take 1 tablet (100 mg total) by mouth daily. 06/10/16   Jami L Hagler, PA-C  ibuprofen (ADVIL,MOTRIN) 600 MG tablet Take 1 tablet (600 mg total) by mouth every 6 (six) hours as needed. 06/10/16   Jami L Hagler, PA-C    Allergies Bactrim [sulfamethoxazole-trimethoprim]; Latex; and Tylenol with codeine #3 [acetaminophen-codeine]  Family History  Problem Relation Age of Onset  . Diabetes  Mother   . Hypertension Mother   . Pancreatitis Mother   . Endometriosis Mother   . Cancer Paternal Grandfather     Social History Social History  Substance Use Topics  . Smoking status: Former Smoker    Packs/day: 1.00    Types: Cigarettes, Cigars  . Smokeless tobacco: Never Used  . Alcohol use 1.1 oz/week    1 Glasses of wine, 1 Standard drinks or equivalent per week    Review of Systems Constitutional: No fever/chills Eyes: No visual changes. ENT: No sore throat. Cardiovascular: Denies chest pain. Respiratory: Denies shortness of breath. Gastrointestinal: No abdominal pain.  No nausea, no vomiting.  No diarrhea.  No constipation. Genitourinary: Negative for dysuria. Musculoskeletal: Negative for back pain. Skin: Negative for rash. Neurological: Negative for headaches, focal weakness or numbness.  10-point ROS otherwise negative.  ____________________________________________   PHYSICAL EXAM:  VITAL SIGNS: ED Triage Vitals  Enc Vitals Group     BP 08/06/16 1508 131/73     Pulse Rate 08/06/16 1508 96     Resp 08/06/16 1508 20     Temp 08/06/16 1508 98.4 F (36.9 C)     Temp Source 08/06/16 1508 Oral     SpO2 08/06/16 1508 100 %     Weight 08/06/16 1508 125 lb (56.7 kg)     Height 08/06/16 1508 5\' 1"  (1.549 m)     Head Circumference --      Peak Flow --  Pain Score 08/06/16 1511 10     Pain Loc --      Pain Edu? --      Excl. in GC? --     Constitutional: Alert and oriented. Well appearing and in no acute distress. Eyes: Conjunctivae are normal. PERRL. EOMI. Head: Atraumatic. Nose: Bilateral clear rhinorrhea. Mouth/Throat: Mucous membranes are moist. . Neck: No stridor.   Cardiovascular: Normal rate, regular rhythm. Grossly normal heart sounds.  Chest pain is reproducible on palpation to the right side of the chest. Respiratory: Normal respiratory effort.  No retractions. Lungs CTAB. Mildly prolonged expiratory phase of  respiration. Gastrointestinal: Soft and nontender. No distention.  Musculoskeletal: No lower extremity tenderness nor edema.  No joint effusions. Neurologic:  Normal speech and language. No gross focal neurologic deficits are appreciated. No gait instability. Skin:  Skin is warm, dry and intact. No rash noted. Psychiatric: Mood and affect are normal. Speech and behavior are normal.  ____________________________________________   LABS (all labs ordered are listed, but only abnormal results are displayed)  Labs Reviewed  BASIC METABOLIC PANEL  CBC  TROPONIN I   ____________________________________________  EKG  ED ECG REPORT I, Jovaughn Wojtaszek,  Teena Iraniavid M, the attending physician, personally viewed and interpreted this ECG.   Date: 08/06/2016  EKG Time: 1507  Rate: 101  Rhythm: sinus tachycardia  Axis: Normal axis  Intervals:none  ST&T Change: No ST segment elevation or depression. No abnormal T-wave inversion. EKG machine read abnormal T waves in the inferior leads. However, I believe the read is secondary to a baseline disturbance.  ____________________________________________  RADIOLOGY  DG Chest 2 View (Accession [id number]) (Order [id number])  Imaging  Date: 08/06/2016 Department: [facility] EMERGENCY DEPARTMENT Released By: Earma ReadingJane A Ryan, RN (auto-released) Authorizing: Myrna Blazeravid Matthew Miya Luviano, MD  Exam Information   Status Exam Begun  Exam Ended   Final [99] 08/06/2016 3:33 PM 08/06/2016 3:35 PM  PACS Images   Show images for DG Chest 2 View  Study Result   CLINICAL DATA:  Onset of productive cough last night. The patient is experiencing chest pain and shortness of breath for the past 2 hours. Patient is a former smoker.  EXAM: CHEST  2 VIEW  COMPARISON:  Chest x-ray of April 24, 2005  FINDINGS: The lungs are well-expanded. There is no focal infiltrate. The interstitial markings are coarse bilaterally. The heart and pulmonary vascularity  are normal. The mediastinum is normal in width. There is calcification within the wall of the aortic arch. The bony thorax exhibits no acute abnormality.  IMPRESSION: Mild chronic bronchitic changes. No alveolar pneumonia, CHF, nor other acute cardiopulmonary abnormality. The wound   Electronically Signed   By: Borghild Thaker  [place] M.D.   On: 08/06/2016 15:38     ____________________________________________   PROCEDURES  Procedure(s) performed:   Procedures  Critical Care performed:   ____________________________________________   INITIAL IMPRESSION / ASSESSMENT AND PLAN / ED COURSE  Pertinent labs & imaging results that were available during my care of the patient were reviewed by me and considered in my medical decision making (see chart for details).  Likely bronchitis versus atypical pneumonia. Prolonged course of symptoms. Unlikely to be pulmonary embolus. No pleuritic chest pain. Symptoms have a more likely explanation.  Clinical Course   ----------------------------------------- 7:21 PM on 08/06/2016 -----------------------------------------  Patient is resting comfortable at this time. Has finished albuterol and is requesting discharge to home. We'll discharge with an albuterol inhaler as well as azithromycin. No distress at this time. No respiratory  distress. Not complaining of any chest pain. Will follow-up at the [facility] clinic.  ____________________________________________   FINAL CLINICAL IMPRESSION(S) / ED DIAGNOSES  Chest wall pain. Bronchitis versus atypical pneumonia.    NEW MEDICATIONS STARTED DURING THIS VISIT:  New Prescriptions   No medications on file     Note:  This document was prepared using Dragon voice recognition software and may include unintentional dictation errors.    Myrna Blazer, MD 08/06/16 Ernestina Columbia

## 2016-08-06 NOTE — ED Notes (Signed)
Pt discharged to home.  Discharge instructions reviewed.  Verbalized understanding.  No questions or concerns at this time.  Teach back verified.  Pt in NAD.  No items left in ED.   

## 2017-05-05 ENCOUNTER — Encounter: Payer: Self-pay | Admitting: Emergency Medicine

## 2017-05-05 ENCOUNTER — Emergency Department
Admission: EM | Admit: 2017-05-05 | Discharge: 2017-05-05 | Disposition: A | Discharge status: Home / Self Care | Payer: 59 | Attending: Emergency Medicine | Admitting: Emergency Medicine

## 2017-05-05 DIAGNOSIS — R1033 Periumbilical pain: Secondary | ICD-10-CM | POA: Insufficient documentation

## 2017-05-05 DIAGNOSIS — Z87891 Personal history of nicotine dependence: Secondary | ICD-10-CM | POA: Diagnosis not present

## 2017-05-05 DIAGNOSIS — Z91040 Latex allergy status: Secondary | ICD-10-CM | POA: Diagnosis not present

## 2017-05-05 DIAGNOSIS — Z791 Long term (current) use of non-steroidal anti-inflammatories (NSAID): Secondary | ICD-10-CM | POA: Insufficient documentation

## 2017-05-05 DIAGNOSIS — R112 Nausea with vomiting, unspecified: Secondary | ICD-10-CM | POA: Diagnosis not present

## 2017-05-05 LAB — URINALYSIS, COMPLETE (UACMP) WITH MICROSCOPIC
Bacteria, UA: NONE SEEN
Bilirubin Urine: NEGATIVE
Glucose, UA: NEGATIVE mg/dL
Ketones, ur: NEGATIVE mg/dL
Leukocytes, UA: NEGATIVE
Nitrite: NEGATIVE
Protein, ur: NEGATIVE mg/dL
Specific Gravity, Urine: 1.021 (ref 1.005–1.030)
pH: 5 (ref 5.0–8.0)

## 2017-05-05 LAB — COMPREHENSIVE METABOLIC PANEL
ALT: 15 U/L (ref 14–54)
AST: 21 U/L (ref 15–41)
Albumin: 4.2 g/dL (ref 3.5–5.0)
Alkaline Phosphatase: 63 U/L (ref 38–126)
Anion gap: 7 (ref 5–15)
BUN: 17 mg/dL (ref 6–20)
CO2: 27 mmol/L (ref 22–32)
Calcium: 9 mg/dL (ref 8.9–10.3)
Chloride: 106 mmol/L (ref 101–111)
Creatinine, Ser: 0.85 mg/dL (ref 0.44–1.00)
GFR calc Af Amer: >60 mL/min (ref >60)
GFR calc non Af Amer: >60 mL/min (ref >60)
Glucose, Bld: 104 mg/dL — ABNORMAL HIGH (ref 65–99)
Potassium: 3.9 mmol/L (ref 3.5–5.1)
Sodium: 140 mmol/L (ref 135–145)
Total Bilirubin: 0.3 mg/dL (ref 0.3–1.2)
Total Protein: 7.2 g/dL (ref 6.5–8.1)

## 2017-05-05 LAB — CBC
HCT: 42.3 % (ref 35.0–47.0)
Hemoglobin: 14.4 g/dL (ref 12.0–16.0)
MCH: 30.1 pg (ref 26.0–34.0)
MCHC: 34 g/dL (ref 32.0–36.0)
MCV: 88.4 fL (ref 80.0–100.0)
Platelets: 256 10*3/uL (ref 150–440)
RBC: 4.79 MIL/uL (ref 3.80–5.20)
RDW: 13 % (ref 11.5–14.5)
WBC: 8.3 10*3/uL (ref 3.6–11.0)

## 2017-05-05 LAB — LIPASE, BLOOD: Lipase: 25 U/L (ref 11–51)

## 2017-05-05 LAB — POCT PREGNANCY, URINE: Preg Test, Ur: NEGATIVE

## 2017-05-05 MED ORDER — DICYCLOMINE HCL 20 MG PO TABS: 20.0000 mg | ORAL_TABLET | Freq: Three times a day (TID) | ORAL | 0 refills | Status: DC | PRN

## 2017-05-05 MED ORDER — DICYCLOMINE HCL 10 MG/ML IM SOLN
20.0000 mg | Freq: Once | INTRAMUSCULAR | Status: AC
  Administered 2017-05-05: 20 mg via INTRAMUSCULAR
  Filled 2017-05-05: qty 2

## 2017-05-05 NOTE — ED Notes (Addendum)
Pt reports to ED w/ c/o umbilical abd pain that radiates to both sides. Nontender to palpation. Pt reports normal BM today. Pt denies vaginal discharge or urinary s/s. Pt alert, resp even and unlabored. Reports 1 episode of emesis but +PO intake since.

## 2017-05-05 NOTE — ED Triage Notes (Signed)
Patient to ER for mid abd pain since last night. Patient reports nausea (none currently) with vomiting x1. Denies diarrhea, but has had BM today. Patient states this pain does not feel like any pain associated with listed items in medical history (cysts, kidney stones, endometriosis). Patient describes pain as a cramping feeling that "comes in waves".

## 2017-05-05 NOTE — ED Notes (Signed)
Report given to Kala RN. 

## 2017-05-05 NOTE — Discharge Instructions (Signed)
Please seek medical attention for any high fevers, chest pain, shortness of breath, change in behavior, persistent vomiting, bloody stool or any other new or concerning symptoms.  

## 2017-05-05 NOTE — ED Provider Notes (Signed)
[facility] Emergency Department Provider Note   ____________________________________________   I have reviewed the triage vital signs and the nursing notes.   HISTORY  Chief Complaint Abdominal Pain   History limited by: Not Limited   HPI Crystal Riley is a 36 y.o. female who presents to the emergency department today because of abdominal pain. It is located in the periumbilical region. It started 2 days ago. It is progressively gotten worse. She describes it as cramping. It has been associated with some nausea and vomiting. She denies any diarrhea or bloody stool. She denies any fevers. Denies similar pain in the past.   Past Medical History:  Diagnosis Date  . Endometriosis   . Kidney stone   . Medical history non-contributory   . Ovarian cyst     There are no active problems to display for this patient.   Past Surgical History:  Procedure Laterality Date  . LAPAROSCOPY    . TONSILLECTOMY      Prior to Admission medications   Medication Sig Start Date End Date Taking? Authorizing Provider  albuterol (PROVENTIL HFA;VENTOLIN HFA) 108 (90 Base) MCG/ACT inhaler Inhale 2 puffs into the lungs every 6 (six) hours as needed for wheezing or shortness of breath. 08/06/16   Schaevitz, Myra Rudeavid Matthew, MD  cephALEXin (KEFLEX) 500 MG capsule Take 1 capsule (500 mg total) by mouth 3 (three) times daily. 06/10/16   Hagler, Jami L, PA-C  ibuprofen (ADVIL,MOTRIN) 600 MG tablet Take 1 tablet (600 mg total) by mouth every 6 (six) hours as needed. 06/10/16   Hagler, Jami L, PA-C    Allergies Bactrim [sulfamethoxazole-trimethoprim]; Latex; and Tylenol with codeine #3 [acetaminophen-codeine]  Family History  Problem Relation Age of Onset  . Diabetes Mother   . Hypertension Mother   . Pancreatitis Mother   . Endometriosis Mother   . Cancer Paternal Grandfather     Social History Social History  Substance Use Topics  . Smoking status: Former Smoker     Packs/day: 1.00    Types: Cigarettes, Cigars  . Smokeless tobacco: Never Used  . Alcohol use 1.1 oz/week    1 Glasses of wine, 1 Standard drinks or equivalent per week    Review of Systems Constitutional: No fever/chills Eyes: No visual changes. ENT: No sore throat. Cardiovascular: Denies chest pain. Respiratory: Denies shortness of breath. Gastrointestinal: Positive for abdominal pain. Genitourinary: Negative for dysuria. Musculoskeletal: Negative for back pain. Skin: Negative for rash. Neurological: Negative for headaches, focal weakness or numbness.  ____________________________________________   PHYSICAL EXAM:  VITAL SIGNS: ED Triage Vitals  Enc Vitals Group     BP 05/05/17 0159 130/74     Pulse Rate 05/05/17 0159 93     Resp 05/05/17 0159 20     Temp 05/05/17 0159 98.1 F (36.7 C)     Temp Source 05/05/17 0159 Oral     SpO2 05/05/17 0159 100 %     Weight 05/05/17 0200 137 lb (62.1 kg)     Height 05/05/17 0200 5\' 1"  (1.549 m)     Head Circumference --      Peak Flow --      Pain Score 05/05/17 0159 6   Constitutional: Alert and oriented. Well appearing and in no distress. Eyes: Conjunctivae are normal.  ENT   Head: Normocephalic and atraumatic.   Nose: No congestion/rhinnorhea.   Mouth/Throat: Mucous membranes are moist.   Neck: No stridor. Hematological/Lymphatic/Immunilogical: No cervical lymphadenopathy. Cardiovascular: Normal rate, regular rhythm.  No murmurs, rubs,  or gallops.  Respiratory: Normal respiratory effort without tachypnea nor retractions. Breath sounds are clear and equal bilaterally. No wheezes/rales/rhonchi. Gastrointestinal: Soft and non tender. No rebound. No guarding.  Genitourinary: Deferred Musculoskeletal: Normal range of motion in all extremities. No lower extremity edema. Neurologic:  Normal speech and language. No gross focal neurologic deficits are appreciated.  Skin:  Skin is warm, dry and intact. No rash  noted. Psychiatric: Mood and affect are normal. Speech and behavior are normal. Patient exhibits appropriate insight and judgment.  ____________________________________________    LABS (pertinent positives/negatives)  Labs Reviewed  COMPREHENSIVE METABOLIC PANEL - Abnormal; Notable for the following:       Result Value   Glucose, Bld 104 (*)    All other components within normal limits  URINALYSIS, COMPLETE (UACMP) WITH MICROSCOPIC - Abnormal; Notable for the following:    Color, Urine YELLOW (*)    APPearance CLEAR (*)    Hgb urine dipstick SMALL (*)    Squamous Epithelial / LPF 0-5 (*)    All other components within normal limits  LIPASE, BLOOD  CBC  POC URINE PREG, ED  POCT PREGNANCY, URINE     ____________________________________________   EKG  None  ____________________________________________    RADIOLOGY  None  ____________________________________________   PROCEDURES  Procedures  ____________________________________________   INITIAL IMPRESSION / ASSESSMENT AND PLAN / ED COURSE  Pertinent labs & imaging results that were available during my care of the patient were reviewed by me and considered in my medical decision making (see chart for details).  Patient presented to the emergency department today because of abdominal pain. Blood work without concerning findings. Patient was given IM bentyl and was able to nap afterwards. Upon awakening her pain had improved significantly. Discussed possibility of imaging, however given improvement patient felt comfortable deferring. Will plan on discharging. Discussed return precautions.  ____________________________________________   FINAL CLINICAL IMPRESSION(S) / ED DIAGNOSES  Final diagnoses:  Periumbilical abdominal pain     Note: This dictation was prepared with Dragon dictation. Any transcriptional errors that result from this process are unintentional     Phineas Semen, MD 05/05/17 [phone]

## 2018-02-01 ENCOUNTER — Encounter: Payer: Self-pay | Admitting: Emergency Medicine

## 2018-02-01 ENCOUNTER — Other Ambulatory Visit: Payer: Self-pay

## 2018-02-01 ENCOUNTER — Emergency Department: Payer: 59

## 2018-02-01 ENCOUNTER — Emergency Department
Admission: EM | Admit: 2018-02-01 | Discharge: 2018-02-01 | Disposition: A | Discharge status: Home / Self Care | Payer: 59 | Attending: Emergency Medicine | Admitting: Emergency Medicine

## 2018-02-01 DIAGNOSIS — M778 Other enthesopathies, not elsewhere classified: Secondary | ICD-10-CM

## 2018-02-01 DIAGNOSIS — M779 Enthesopathy, unspecified: Secondary | ICD-10-CM | POA: Diagnosis not present

## 2018-02-01 DIAGNOSIS — M79644 Pain in right finger(s): Secondary | ICD-10-CM | POA: Diagnosis present

## 2018-02-01 DIAGNOSIS — Z87891 Personal history of nicotine dependence: Secondary | ICD-10-CM | POA: Insufficient documentation

## 2018-02-01 DIAGNOSIS — Z91040 Latex allergy status: Secondary | ICD-10-CM | POA: Diagnosis not present

## 2018-02-01 DIAGNOSIS — Z79899 Other long term (current) drug therapy: Secondary | ICD-10-CM | POA: Insufficient documentation

## 2018-02-01 LAB — CBC WITH DIFFERENTIAL/PLATELET
Basophils Absolute: 0.1 10*3/uL (ref 0–0.1)
Basophils Relative: 1 %
Eosinophils Absolute: 0.2 10*3/uL (ref 0–0.7)
Eosinophils Relative: 3 %
HCT: 41.8 % (ref 35.0–47.0)
Hemoglobin: 13.9 g/dL (ref 12.0–16.0)
Lymphocytes Relative: 34 %
Lymphs Abs: 2.6 10*3/uL (ref 1.0–3.6)
MCH: 29.1 pg (ref 26.0–34.0)
MCHC: 33.3 g/dL (ref 32.0–36.0)
MCV: 87.1 fL (ref 80.0–100.0)
Monocytes Absolute: 0.8 10*3/uL (ref 0.2–0.9)
Monocytes Relative: 10 %
Neutro Abs: 4 10*3/uL (ref 1.4–6.5)
Neutrophils Relative %: 52 %
Platelets: 267 10*3/uL (ref 150–440)
RBC: 4.8 MIL/uL (ref 3.80–5.20)
RDW: 13 % (ref 11.5–14.5)
WBC: 7.6 10*3/uL (ref 3.6–11.0)

## 2018-02-01 LAB — COMPREHENSIVE METABOLIC PANEL
ALT: 16 U/L (ref 14–54)
AST: 22 U/L (ref 15–41)
Albumin: 4.2 g/dL (ref 3.5–5.0)
Alkaline Phosphatase: 72 U/L (ref 38–126)
Anion gap: 5 (ref 5–15)
BUN: 18 mg/dL (ref 6–20)
CO2: 27 mmol/L (ref 22–32)
Calcium: 8.6 mg/dL — ABNORMAL LOW (ref 8.9–10.3)
Chloride: 106 mmol/L (ref 101–111)
Creatinine, Ser: 0.67 mg/dL (ref 0.44–1.00)
GFR calc Af Amer: >60 mL/min (ref >60)
GFR calc non Af Amer: >60 mL/min (ref >60)
Glucose, Bld: 93 mg/dL (ref 65–99)
Potassium: 3.8 mmol/L (ref 3.5–5.1)
Sodium: 138 mmol/L (ref 135–145)
Total Bilirubin: 0.5 mg/dL (ref 0.3–1.2)
Total Protein: 7 g/dL (ref 6.5–8.1)

## 2018-02-01 LAB — URIC ACID: Uric Acid, Serum: 3.2 mg/dL (ref 2.3–6.6)

## 2018-02-01 MED ORDER — MELOXICAM 15 MG PO TABS: 15.0000 mg | ORAL_TABLET | Freq: Every day | ORAL | 0 refills | Status: AC

## 2018-02-01 MED ORDER — KETOROLAC TROMETHAMINE 30 MG/ML IJ SOLN
30.0000 mg | Freq: Once | INTRAMUSCULAR | Status: AC
  Administered 2018-02-01: 30 mg via INTRAVENOUS
  Filled 2018-02-01: qty 1

## 2018-02-01 NOTE — ED Triage Notes (Signed)
Patient ambulatory to triage with steady gait, without difficulty or distress noted; pt reports pain/swelling to right thumb since awakening this am with no known cause

## 2018-02-01 NOTE — ED Notes (Signed)
Pt states that she woke up her and thumb on right hand is swollen and painful.

## 2018-02-01 NOTE — ED Provider Notes (Signed)
[facility] Emergency Department Provider Note  ____________________________________________  Time seen: Approximately 9:08 PM  I have reviewed the triage vital signs and the nursing notes.   HISTORY  Chief Complaint Finger Injury    HPI Crystal Riley is a 37 y.o. female who presents emergency department complaining of pain, erythema, edema to the interphalangeal joint of the right thumb.  Patient has limited range of motion due to pain.  She denies any numbness or tingling.  Patient denies any trauma to the area, either blunt or penetrating.  No history of same symptoms in the past.  Patient does not have a history of gout.  Patient was recently sick with a sinus infection but symptoms have resolved.  Patient reports that the pain is sharp, constant, worse with movement.  No medications prior to arrival.  Symptoms have been ongoing times 1 day.  Past Medical History:  Diagnosis Date  . Endometriosis   . Kidney stone   . Medical history non-contributory   . Ovarian cyst     There are no active problems to display for this patient.   Past Surgical History:  Procedure Laterality Date  . LAPAROSCOPY    . TONSILLECTOMY      Prior to Admission medications   Medication Sig Start Date End Date Taking? Authorizing Provider  albuterol (PROVENTIL HFA;VENTOLIN HFA) 108 (90 Base) MCG/ACT inhaler Inhale 2 puffs into the lungs every 6 (six) hours as needed for wheezing or shortness of breath. Patient not taking: Reported on 05/05/2017 08/06/16   Myrna Blazer, MD  dicyclomine (BENTYL) 20 MG tablet Take 1 tablet (20 mg total) by mouth 3 (three) times daily as needed (abdominal pain). 05/05/17   Phineas Semen, MD  fluticasone [facility]) 50 MCG/ACT nasal spray Place 2 sprays into the nose daily. 07/09/09   [provider]  ibuprofen (ADVIL,MOTRIN) 600 MG tablet Take 1 tablet (600 mg total) by mouth every 6 (six) hours as needed. Patient not  taking: Reported on 05/05/2017 06/10/16   Hagler, Jami L, PA-C  meloxicam (MOBIC) 15 MG tablet Take 1 tablet (15 mg total) by mouth daily. 02/01/18   Nayquan Evinger, Delorise Royals, PA-C    Allergies Bactrim [sulfamethoxazole-trimethoprim]; Latex; and Tylenol with codeine #3 [acetaminophen-codeine]  Family History  Problem Relation Age of Onset  . Diabetes Mother   . Hypertension Mother   . Pancreatitis Mother   . Endometriosis Mother   . Cancer Paternal Grandfather     Social History Social History   Tobacco Use  . Smoking status: Former Smoker    Packs/day: 1.00    Types: Cigarettes, Cigars  . Smokeless tobacco: Never Used  Substance Use Topics  . Alcohol use: Yes    Alcohol/week: 1.1 oz    Types: 1 Glasses of wine, 1 Standard drinks or equivalent per week  . Drug use: Yes    Types: Marijuana     Review of Systems  Constitutional: No fever/chills Eyes: No visual changes. No discharge ENT: No upper respiratory complaints. Cardiovascular: no chest pain. Respiratory: no cough. No SOB. Gastrointestinal: No abdominal pain.  No nausea, no vomiting.  No diarrhea.  No constipation. Musculoskeletal: Pain, swelling, erythema to the interphalangeal joint of the right thumb. Skin: Negative for rash, abrasions, lacerations, ecchymosis. Neurological: Negative for headaches, focal weakness or numbness. 10-point ROS otherwise negative.  ____________________________________________   PHYSICAL EXAM:  VITAL SIGNS: ED Triage Vitals  Enc Vitals Group     BP 02/01/18 2006 124/87  Pulse Rate 02/01/18 2006 (!) 107     Resp 02/01/18 2006 20     Temp 02/01/18 2006 97.9 F (36.6 C)     Temp Source 02/01/18 2006 Oral     SpO2 02/01/18 2006 100 %     Weight 02/01/18 2007 147 lb (66.7 kg)     Height 02/01/18 2007 5\' 1"  (1.549 m)     Head Circumference --      Peak Flow --      Pain Score 02/01/18 2007 6     Pain Loc --      Pain Edu? --      Excl. in GC? --      Constitutional: Alert  and oriented. Well appearing and in no acute distress. Eyes: Conjunctivae are normal. PERRL. EOMI. Head: Atraumatic. Neck: No stridor.    Cardiovascular: Normal rate, regular rhythm. Normal S1 and S2.  Good peripheral circulation. Respiratory: Normal respiratory effort without tachypnea or retractions. Lungs CTAB. Good air entry to the bases with no decreased or absent breath sounds. Musculoskeletal: Full range of motion to all extremities. No gross deformities appreciated. Neurologic:  Normal speech and language. No gross focal neurologic deficits are appreciated.  Skin:  Skin is warm, dry and intact. No rash noted. Psychiatric: Mood and affect are normal. Speech and behavior are normal. Patient exhibits appropriate insight and judgement.   ____________________________________________   LABS (all labs ordered are listed, but only abnormal results are displayed)  Labs Reviewed  COMPREHENSIVE METABOLIC PANEL - Abnormal; Notable for the following components:      Result Value   Calcium 8.6 (*)    All other components within normal limits  CBC WITH DIFFERENTIAL/PLATELET  URIC ACID   ____________________________________________  EKG   ____________________________________________  RADIOLOGY Festus Barren Braedyn Kauk, personally viewed and evaluated these images (plain radiographs) as part of my medical decision making, as well as reviewing the written report by the radiologist.  Curve with radiologist finding of no acute fractures or dislocation.  Calcific finding at the base of the distal phalanx is appreciated.  Mild soft tissue edema is also appreciated on exam.  Dg Finger Thumb Right  Result Date: 02/01/2018 CLINICAL DATA:  Right thumb pain and swelling beginning today without injury. EXAM: RIGHT THUMB 2+V COMPARISON:  None. FINDINGS: There is a small amount of faint calcification within the soft tissues adjacent to the base of the distal phalanx of the thumb. No fracture,  dislocation, joint space narrowing, or osseous erosion is identified. There is mild soft tissue swelling involving the thumb. IMPRESSION: Soft tissue swelling and calcification about the thumb IP joint without evidence of erosive arthropathy or acute osseous abnormality. Electronically Signed   By: Sebastian Ache M.D.   On: 02/01/2018 20:45    ____________________________________________    PROCEDURES  Procedure(s) performed:    .Splint Application Date/Time: 02/01/2018 10:04 PM Performed by: Racheal Patches, PA-C Authorized by: Racheal Patches, PA-C   Consent:    Consent obtained:  Verbal   Consent given by:  Patient   Risks discussed:  Pain Procedure details:    Laterality:  Right   Location:  Finger   Finger:  R thumb   Splint type:  Thumb spica   Supplies:  Prefabricated splint Post-procedure details:    Pain:  Improved   Sensation:  Normal   Patient tolerance of procedure:  Tolerated well, no immediate complications      Medications  ketorolac (TORADOL) 30 MG/ML injection 30 mg (has  no administration in time range)     ____________________________________________   INITIAL IMPRESSION / ASSESSMENT AND PLAN / ED COURSE  Pertinent labs & imaging results that were available during my care of the patient were reviewed by me and considered in my medical decision making (see chart for details).  Review of the [place] CSRS was performed in accordance of the [facility] prior to dispensing any controlled drugs.     Patient's diagnosis is consistent with tendinitis of the right thumb.  Patient presented with nontraumatic pain, erythema, edema to the interphalangeal joint of the right thumb.  Differential included cellulitis, gout, fracture, infectious tenosynovitis, tendinitis.  X-ray was reassuring with no acute osseous abnormality.  Labs returned with reassuring results.  At this time, consistent with tendinitis versus reactive arthritis.  I suspect that this is  tendinitis due to patient's symptoms.  Patient is given a removable thumb spica splint and placed on anti-inflammatory therapy for improvement.. Patient will be discharged home with prescriptions for meloxicam. Patient is to follow up with hand surgery as needed or otherwise directed. Patient is given ED precautions to return to the ED for any worsening or new symptoms.     ____________________________________________  FINAL CLINICAL IMPRESSION(S) / ED DIAGNOSES  Final diagnoses:  Tendinitis of thumb      NEW MEDICATIONS STARTED DURING THIS VISIT:  ED Discharge Orders        Ordered    meloxicam (MOBIC) 15 MG tablet  Daily     02/01/18 2204          This chart was dictated using voice recognition software/Dragon. Despite best efforts to proofread, errors can occur which can change the meaning. Any change was purely unintentional.    Racheal PatchesCuthriell, Mattalyn Anderegg D, PA-C 02/01/18 2205    Dionne BucySiadecki, Sebastian, MD 02/01/18 2225

## 2018-04-21 ENCOUNTER — Other Ambulatory Visit: Payer: Self-pay

## 2018-04-21 ENCOUNTER — Emergency Department
Admission: EM | Admit: 2018-04-21 | Discharge: 2018-04-21 | Disposition: A | Discharge status: Home / Self Care | Payer: 59 | Attending: Student in an Organized Health Care Education/Training Program | Admitting: Student in an Organized Health Care Education/Training Program

## 2018-04-21 ENCOUNTER — Encounter: Payer: Self-pay | Admitting: *Deleted

## 2018-04-21 DIAGNOSIS — Z87891 Personal history of nicotine dependence: Secondary | ICD-10-CM | POA: Insufficient documentation

## 2018-04-21 DIAGNOSIS — Z91040 Latex allergy status: Secondary | ICD-10-CM | POA: Diagnosis not present

## 2018-04-21 DIAGNOSIS — Y939 Activity, unspecified: Secondary | ICD-10-CM | POA: Diagnosis not present

## 2018-04-21 DIAGNOSIS — Z79899 Other long term (current) drug therapy: Secondary | ICD-10-CM | POA: Diagnosis not present

## 2018-04-21 DIAGNOSIS — Y92410 Unspecified street and highway as the place of occurrence of the external cause: Secondary | ICD-10-CM | POA: Diagnosis not present

## 2018-04-21 DIAGNOSIS — M542 Cervicalgia: Secondary | ICD-10-CM | POA: Insufficient documentation

## 2018-04-21 DIAGNOSIS — Y999 Unspecified external cause status: Secondary | ICD-10-CM | POA: Diagnosis not present

## 2018-04-21 DIAGNOSIS — S199XXA Unspecified injury of neck, initial encounter: Secondary | ICD-10-CM | POA: Diagnosis present

## 2018-04-21 DIAGNOSIS — M7918 Myalgia, other site: Secondary | ICD-10-CM

## 2018-04-21 MED ORDER — METAXALONE 800 MG PO TABS: 800.0000 mg | ORAL_TABLET | Freq: Three times a day (TID) | ORAL | 0 refills | Status: AC

## 2018-04-21 MED ORDER — ONDANSETRON 4 MG PO TBDP: 4.0000 mg | ORAL_TABLET | Freq: Three times a day (TID) | ORAL | 0 refills | Status: AC | PRN
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
## 2018-07-10 ENCOUNTER — Emergency Department: Payer: 59

## 2018-07-10 ENCOUNTER — Other Ambulatory Visit: Payer: Self-pay

## 2018-07-10 ENCOUNTER — Emergency Department
Admission: EM | Admit: 2018-07-10 | Discharge: 2018-07-10 | Disposition: A | Discharge status: Home / Self Care | Payer: 59 | Attending: Emergency Medicine | Admitting: Emergency Medicine

## 2018-07-10 ENCOUNTER — Encounter: Payer: Self-pay | Admitting: Emergency Medicine

## 2018-07-10 DIAGNOSIS — N809 Endometriosis, unspecified: Secondary | ICD-10-CM | POA: Diagnosis not present

## 2018-07-10 DIAGNOSIS — R102 Pelvic and perineal pain: Secondary | ICD-10-CM | POA: Insufficient documentation

## 2018-07-10 DIAGNOSIS — Z87891 Personal history of nicotine dependence: Secondary | ICD-10-CM | POA: Diagnosis not present

## 2018-07-10 DIAGNOSIS — R1030 Lower abdominal pain, unspecified: Secondary | ICD-10-CM | POA: Diagnosis present

## 2018-07-10 LAB — URINALYSIS, COMPLETE (UACMP) WITH MICROSCOPIC
Bacteria, UA: NONE SEEN
Bilirubin Urine: NEGATIVE
Glucose, UA: NEGATIVE mg/dL
Ketones, ur: NEGATIVE mg/dL
Leukocytes, UA: NEGATIVE
Nitrite: NEGATIVE
Protein, ur: NEGATIVE mg/dL
Specific Gravity, Urine: 1.017 (ref 1.005–1.030)
pH: 6 (ref 5.0–8.0)

## 2018-07-10 LAB — BASIC METABOLIC PANEL
Anion gap: 6 (ref 5–15)
BUN: 18 mg/dL (ref 6–20)
CO2: 26 mmol/L (ref 22–32)
Calcium: 9.2 mg/dL (ref 8.9–10.3)
Chloride: 105 mmol/L (ref 98–111)
Creatinine, Ser: 0.7 mg/dL (ref 0.44–1.00)
GFR calc Af Amer: >60 mL/min (ref >60)
GFR calc non Af Amer: >60 mL/min (ref >60)
Glucose, Bld: 120 mg/dL — ABNORMAL HIGH (ref 70–99)
Potassium: 4.3 mmol/L (ref 3.5–5.1)
Sodium: 137 mmol/L (ref 135–145)

## 2018-07-10 LAB — WET PREP, GENITAL
Clue Cells Wet Prep HPF POC: NONE SEEN
Sperm: NONE SEEN
Trich, Wet Prep: NONE SEEN
Yeast Wet Prep HPF POC: NONE SEEN

## 2018-07-10 LAB — CBC WITH DIFFERENTIAL/PLATELET
Basophils Absolute: 0.1 10*3/uL (ref 0–0.1)
Basophils Relative: 1 %
Eosinophils Absolute: 0.2 10*3/uL (ref 0–0.7)
Eosinophils Relative: 2 %
HCT: 42.9 % (ref 35.0–47.0)
Hemoglobin: 14.7 g/dL (ref 12.0–16.0)
Lymphocytes Relative: 28 %
Lymphs Abs: 2.8 10*3/uL (ref 1.0–3.6)
MCH: 30.1 pg (ref 26.0–34.0)
MCHC: 34.3 g/dL (ref 32.0–36.0)
MCV: 87.9 fL (ref 80.0–100.0)
Monocytes Absolute: 0.8 10*3/uL (ref 0.2–0.9)
Monocytes Relative: 8 %
Neutro Abs: 5.9 10*3/uL (ref 1.4–6.5)
Neutrophils Relative %: 61 %
Platelets: 267 10*3/uL (ref 150–440)
RBC: 4.88 MIL/uL (ref 3.80–5.20)
RDW: 13.2 % (ref 11.5–14.5)
WBC: 9.7 10*3/uL (ref 3.6–11.0)

## 2018-07-10 LAB — CHLAMYDIA/NGC RT PCR (ARMC ONLY)
Chlamydia Tr: NOT DETECTED
N gonorrhoeae: NOT DETECTED

## 2018-07-10 LAB — POCT PREGNANCY, URINE: Preg Test, Ur: NEGATIVE

## 2018-07-10 MED ORDER — HYDROCODONE-ACETAMINOPHEN 5-325 MG PO TABS: 1.0000 | ORAL_TABLET | ORAL | 0 refills | Status: AC | PRN

## 2018-07-10 NOTE — ED Notes (Signed)
NAD noted at time of D/C. Pt denies questions or concerns. Pt ambulatory to the lobby at this time.  

## 2018-07-10 NOTE — ED Triage Notes (Signed)
Pt arrives ambulatory to triage with c/o lower pelvic pain which began suddenly Saturday morning. Pt is walking hunched over at this time.

## 2018-07-10 NOTE — ED Notes (Signed)
Pt c/o lower pelvic pain that started yesterday morning. Pt denies vaginal bleeding, denies vaginal discharge, denies increased urinary frequency or dysuria. Pt pt is alert and oriented, appears uncomfortable on assessment.

## 2018-07-10 NOTE — ED Provider Notes (Signed)
[facility] Emergency Department Provider Note  Time seen: 8:55 PM  I have reviewed the triage vital signs and the nursing notes.   HISTORY  Chief Complaint Pelvic Pain    HPI Crystal Riley is a 37 y.o. female with a past medical history of endometriosis, ovarian cyst, presents to the emergency department for lower abdominal/pelvic pain.  According to the patient for the past 2 days she has been experiencing lower abdominal/pelvic pain.  Denies any vaginal bleeding or discharge.  Denies any dysuria or hematuria.  Negative for nausea vomiting or diarrhea.  States she has a history of IBS and thought that it was due to IBS at first but states the pain is not gone away is 8/10 dull aching pain across the lower abdomen.    Past Medical History:  Diagnosis Date  . Endometriosis   . Kidney stone   . Medical history non-contributory   . Ovarian cyst     There are no active problems to display for this patient.   Past Surgical History:  Procedure Laterality Date  . LAPAROSCOPY    . TONSILLECTOMY      Prior to Admission medications   Medication Sig Start Date End Date Taking? Authorizing Provider  fluticasone (FLONASE) 50 MCG/ACT nasal spray Place 2 sprays into the nose daily. 07/09/09   [provider]  meloxicam (MOBIC) 15 MG tablet Take 1 tablet (15 mg total) by mouth daily. 02/01/18   Cuthriell, Delorise RoyalsJonathan D, PA-C  ondansetron (ZOFRAN ODT) 4 MG disintegrating tablet Take 1 tablet (4 mg total) by mouth every 8 (eight) hours as needed. 04/21/18   Menshew, Charlesetta IvoryJenise V Bacon, PA-C    Allergies  Allergen Reactions  . Bactrim [Sulfamethoxazole-Trimethoprim] Swelling and Rash  . Latex Swelling and Rash  . Tylenol With Codeine #3 [Acetaminophen-Codeine] Rash    Family History  Problem Relation Age of Onset  . Diabetes Mother   . Hypertension Mother   . Pancreatitis Mother   . Endometriosis Mother   . Cancer Paternal Grandfather     Social  History Social History   Tobacco Use  . Smoking status: Former Smoker    Packs/day: 1.00    Types: Cigarettes, Cigars  . Smokeless tobacco: Never Used  Substance Use Topics  . Alcohol use: Yes    Alcohol/week: 2.0 standard drinks    Types: 1 Glasses of wine, 1 Standard drinks or equivalent per week  . Drug use: Yes    Types: Marijuana    Review of Systems Constitutional: Negative for fever. Cardiovascular: Negative for chest pain. Respiratory: Negative for shortness of breath. Gastrointestinal: Lower abdominal pain.  Negative for nausea vomiting or diarrhea. Genitourinary: Negative for urinary compaints.  Negative for vaginal discharge or bleeding. Musculoskeletal: Negative for musculoskeletal complaints Skin: Negative for skin complaints  Neurological: Negative for headache All other ROS negative  ____________________________________________   PHYSICAL EXAM:  VITAL SIGNS: ED Triage Vitals [07/10/18 2004]  Enc Vitals Group     BP 130/72     Pulse Rate (!) 114     Resp 18     Temp 98.5 F (36.9 C)     Temp Source Oral     SpO2 99 %     Weight 140 lb (63.5 kg)     Height 5\' 1"  (1.549 m)     Head Circumference      Peak Flow      Pain Score 7     Pain Loc  Pain Edu?      Excl. in [place]?     Constitutional: Alert and oriented. Well appearing and in no distress. Eyes: Normal exam ENT   Head: Normocephalic and atraumatic.   Mouth/Throat: Mucous membranes are moist. Cardiovascular: Normal rate, regular rhythm. No murmur Respiratory: Normal respiratory effort without tachypnea nor retractions. Breath sounds are clear  Gastrointestinal: Soft, mild to moderate suprapubic tenderness to palpation.  Otherwise benign abdominal exam without rebound guarding or distention. Musculoskeletal: Nontender with normal range of motion in all extremities. Neurologic:  Normal speech and language. No gross focal neurologic deficits  Skin:  Skin is warm, dry and intact.   Psychiatric: Mood and affect are normal.   ____________________________________________     RADIOLOGY  Ultrasound largely negative.  ____________________________________________   INITIAL IMPRESSION / ASSESSMENT AND PLAN / ED COURSE  Pertinent labs & imaging results that were available during my care of the patient were reviewed by me and considered in my medical decision making (see chart for details).  She presents to the emergency department for lower abdominal pain x2 days.  Differential would include ovarian cyst, PID, UTI, intestinal pain, IBS, colitis or diverticulitis.  Reassuringly patient's lab work is largely within normal limits including a normal white blood cell count.  Urinalysis appears normal.  Urine culture has been added on given a small amount of RBCs.  We will perform a pelvic examination since mom's and obtain a pelvic ultrasound.  Patient states she has a same sexual partner x5 years and does not believe she is at risk for STDs at this time.  Pelvic examination shows a very slight amount of cervical discharge, mild diffuse tenderness without specific tenderness, no significant cervical motion tenderness.  Ultrasound and wet prep pending.  Ultrasound largely negative.  No clear cause for the patient's symptoms at this time.  GC/Chlamydia tests have been sent.  We will discharge with a short course of pain medication.  Have the patient follow-up with her primary care doctor.  Patient agreeable to plan of care. ____________________________________________   FINAL CLINICAL IMPRESSION(S) / ED DIAGNOSES  Pelvic pain    Minna Antis, MD 07/10/18 2210

## 2018-07-10 NOTE — ED Notes (Signed)
Pt returned from US at this time.

## 2020-01-12 IMAGING — US US ART/VEN ABD/PELV/SCROTUM DOPPLER LTD
1 series · 13 of 25 positions shown · non-contrast
Comparison: Abdominal ultrasound 03/11/2009

CLINICAL DATA: Acute onset lower abdominal/pelvic pain.

EXAM:
TRANSABDOMINAL AND TRANSVAGINAL ULTRASOUND OF PELVIS
DOPPLER ULTRASOUND OF OVARIES
TECHNIQUE: Both transabdominal and transvaginal ultrasound examinations of the
pelvis were performed. Transabdominal technique was performed for
global imaging of the pelvis including uterus, ovaries, adnexal
regions, and pelvic cul-de-sac.
It was necessary to proceed with endovaginal exam following the
transabdominal exam to visualize the adnexal structures. Color and
duplex Doppler ultrasound was utilized to evaluate blood flow to the
ovaries.

[Series 1: us art/ven abd/pelv/scrotum doppler ltd · 13 of 84 slices shown]
[im 1/84]
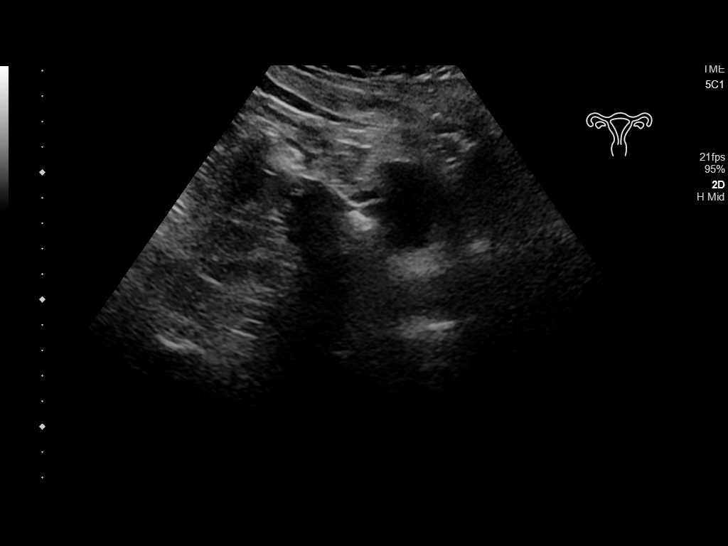
[im 7/84]
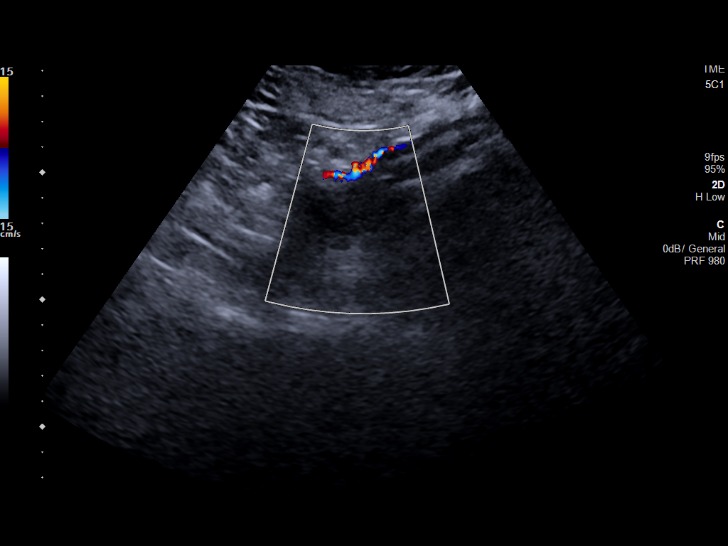
[im 14/84]
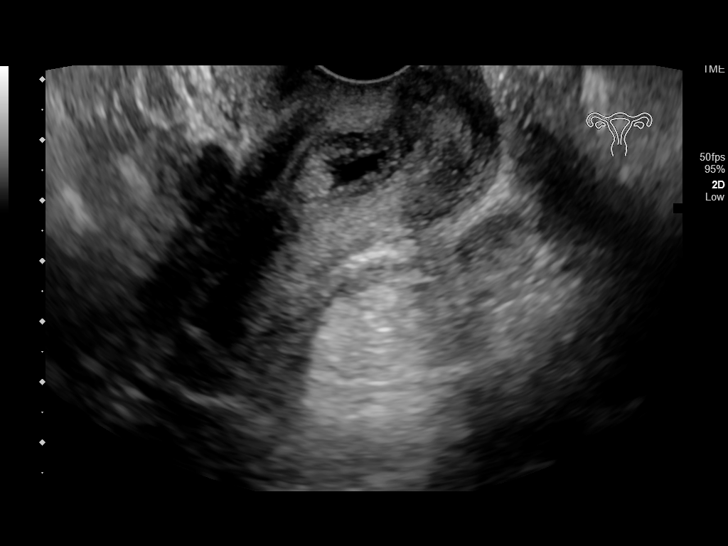
[im 21/84]
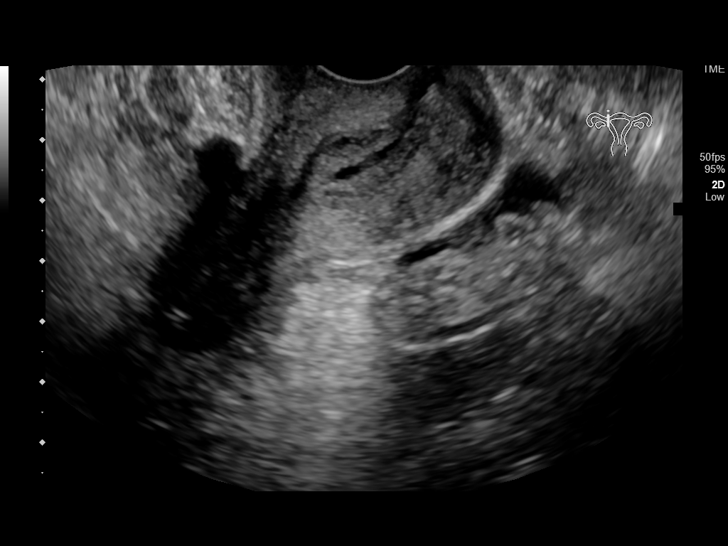
[im 28/84]
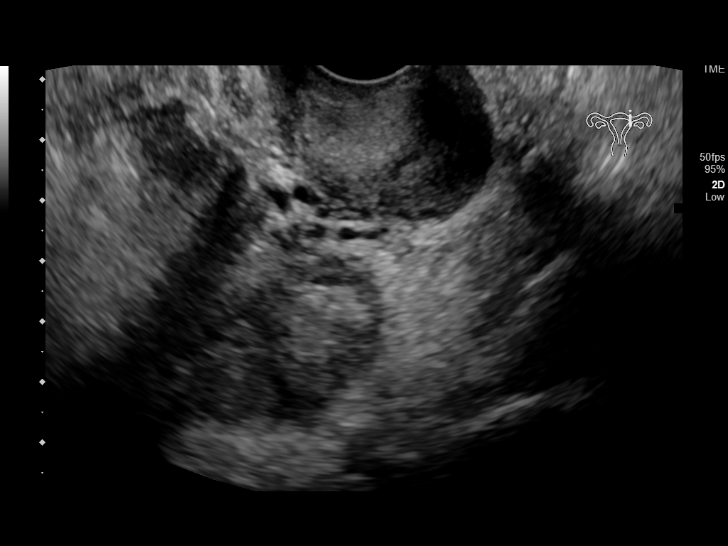
[im 35/84]
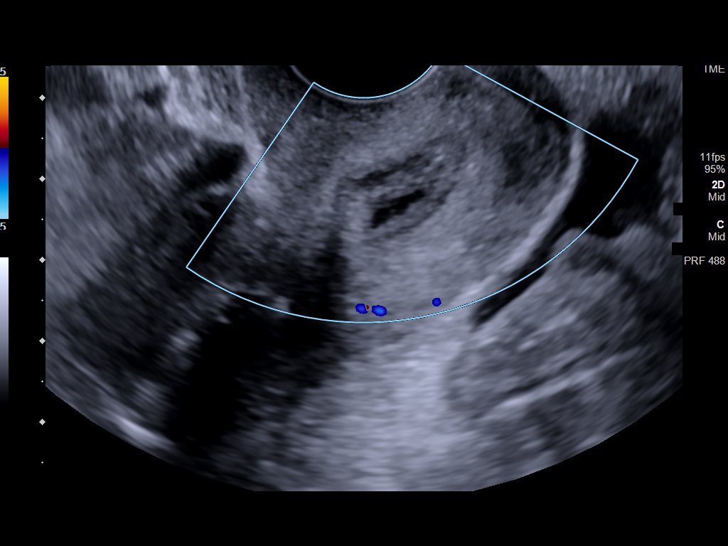
[im 42/84]
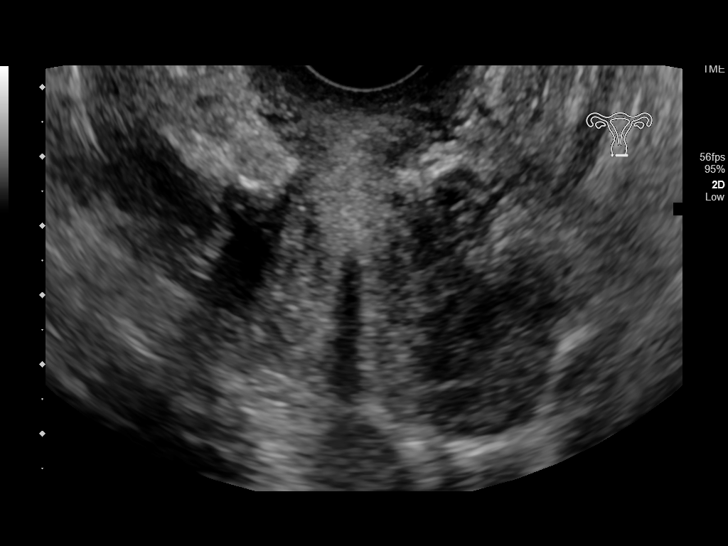
[im 49/84]
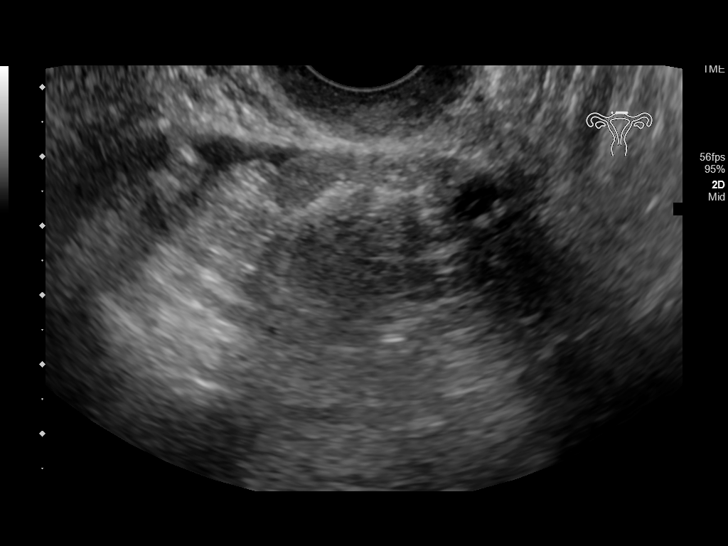
[im 56/84]
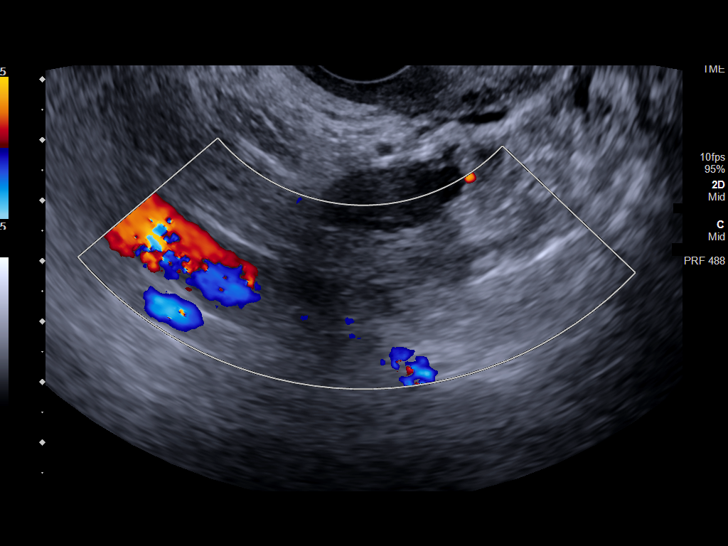
[im 63/84]
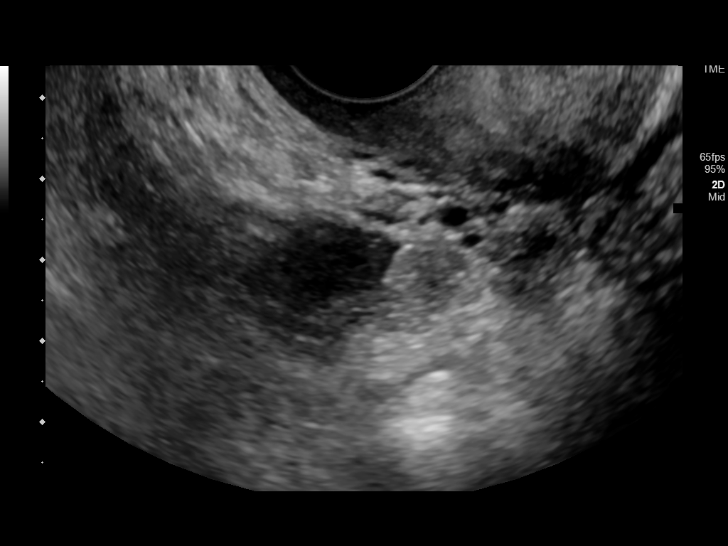
[im 70/84]
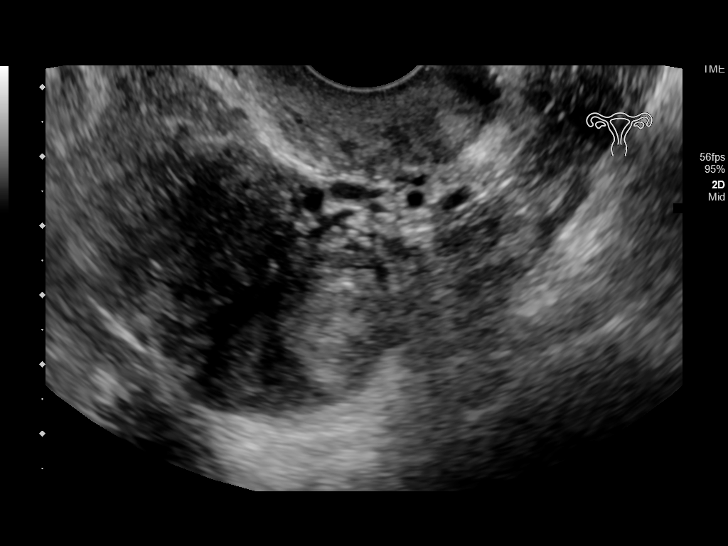
[im 77/84]
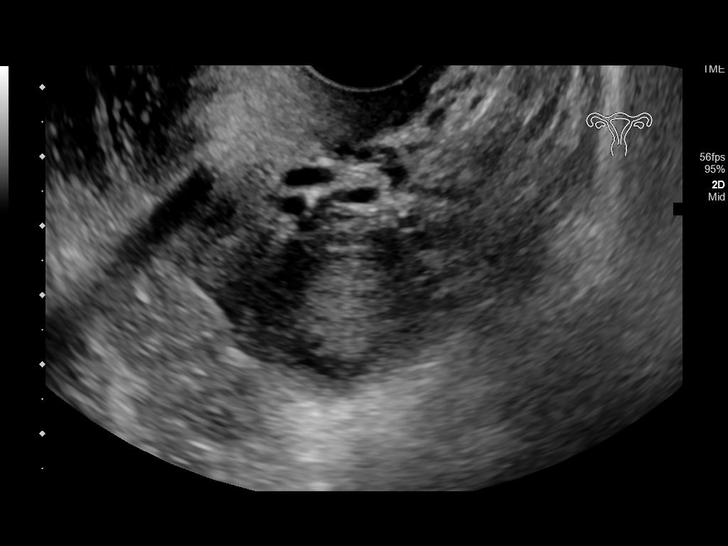
[im 84/84]
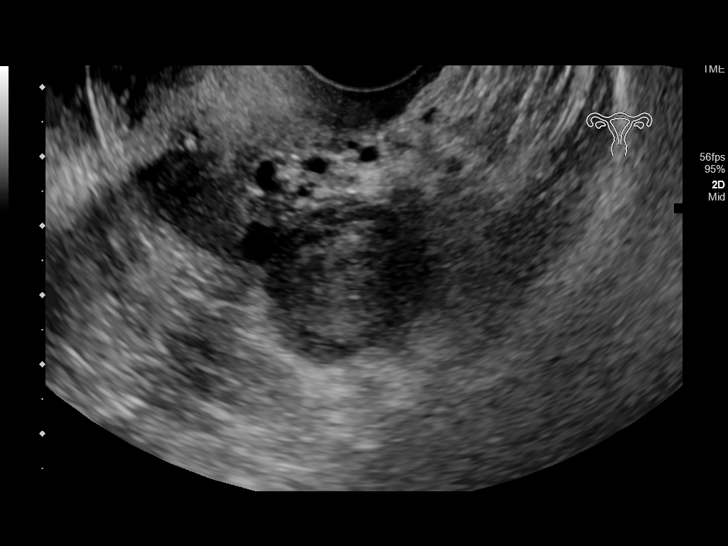

[13 of 25 positions shown; findings below may reference images not displayed]

FINDINGS: Uterus

Measurements: 6.8 x 2.8 x 3.6 cm. No fibroids or other mass
visualized.

Endometrium

Thickness: 2 mm. Intrauterine device appears appropriately position.

Right ovary

Measurements: 2.3 x 0.9 x 2.0 cm. Normal appearance/no adnexal mass.

Left ovary

Measurements: 3.2 x 2.1 x 2.4 cm. There is a 1.5 x 1.7 x 1.5 cm
mildly hyperechoic mass within the left ovary.

Pulsed Doppler evaluation of both ovaries demonstrates normal
low-resistance arterial and venous waveforms.

Other findings

No abnormal free fluid.
IMPRESSION: 1. No acute process within the pelvis.
2. IUD appears appropriately located.
3. There Is a possible mildly hyperechoic mass within the left ovary
which is nonspecific however may represent evolving corpus luteum.
Consider follow-up pelvic ultrasound in 6-8 weeks to assess for
interval resolution.

## 2021-02-25 ENCOUNTER — Other Ambulatory Visit: Payer: Self-pay

## 2021-02-25 ENCOUNTER — Ambulatory Visit: Payer: 59 | Attending: Obstetrics & Gynecology

## 2021-02-25 DIAGNOSIS — R278 Other lack of coordination: Secondary | ICD-10-CM | POA: Insufficient documentation

## 2021-02-25 DIAGNOSIS — M6281 Muscle weakness (generalized): Secondary | ICD-10-CM | POA: Diagnosis present

## 2021-02-25 DIAGNOSIS — N393 Stress incontinence (female) (male): Secondary | ICD-10-CM | POA: Insufficient documentation

## 2021-02-25 DIAGNOSIS — M25552 Pain in left hip: Secondary | ICD-10-CM | POA: Insufficient documentation

## 2021-02-25 DIAGNOSIS — M25551 Pain in right hip: Secondary | ICD-10-CM | POA: Diagnosis present

## 2021-02-25 DIAGNOSIS — R102 Pelvic and perineal pain: Secondary | ICD-10-CM | POA: Diagnosis present

## 2021-02-25 NOTE — Therapy (Signed)
[facility] MAIN [facility] SERVICES [address] [place], [place], [address] Phone: [phone]   Fax:  [phone]  Physical Therapy Evaluation  Patient Details  Name: Crystal Riley MRN: [id number] Date of Birth: 02-15-1981 Referring Provider (PT): Dr. Sandi Carne   Encounter Date: 02/25/2021   PT End of Session - 02/25/21 1626    Visit Number 1    Number of Visits 10    Date for PT Re-Evaluation 05/26/21    Authorization Type UHC- 20 PT visits per year: no visits used this year    PT Start Time 1437    PT Stop Time 1538    PT Time Calculation (min) 61 min    Activity Tolerance Patient tolerated treatment well;No increased pain    Behavior During Therapy WFL for tasks assessed/performed           Past Medical History:  Diagnosis Date  . Endometriosis   . Kidney stone   . Medical history non-contributory   . Ovarian cyst     Past Surgical History:  Procedure Laterality Date  . LAPAROSCOPY    . TONSILLECTOMY      There were no vitals filed for this visit.    Subjective Assessment - 02/25/21 1447    Subjective Pt reported she had pelvic health PT in the past (2013) but pelvic pain has incr. since recent laparascopic procedure for endometriosis. Pt reports B heel pain (Riley>R) s/p surgery. Pt also has B knee pain after standing for 6+ hours. Pt takes Ibuprofen daily 2/2 pain. Pt states pelvic pain is 6/10 at worst s/p surgery, at best: 0/10. Pt has pain with sexual intercourse (reports it is in R ovary and Riley hip) and pt has pain with speculum insertion during annual pap smear. Pt cannot tolerate any sexual position except for missionary. Pt describes it as not feeling right and painful-denied dryness or burning. Pt has an IUD. Pt has leaking with coughing and sneezing. Pt does not wear pads but keeps an extra change of clothes in car in case she leaks. Pt has to change clothes 2-3x/week.  Pt has pain with voiding if bladder is too full. Pt  reported she feels "tight" and pressure prior to bowel movement. Pt would like to get back to the gym with confidence, not incr. pain.  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 Pt has hx of playing sports in high school: cross country, wrestled, soccer, and Biochemist, clinical. Pt does not have children.    Pertinent History Endometriosis, asthma, seasonal allergies, ovarian cyst    How long can you sit comfortably? max 30 minutes before needing to reposition    How long can you stand comfortably? less than 6 hours    Patient Stated Goals To get back to how I should be, I feel older than I should be. Improve core strength, decr. pain.    Currently in Pain? Yes    Pain Score 3     Pain Location Buttocks    Pain Orientation Posterior    Pain Descriptors / Indicators Aching    Pain Type Chronic pain    Pain Radiating Towards BLEs    Pain Onset More than a month ago    Pain Frequency Intermittent    Aggravating Factors  sitting for 30+ minutes    Pain Relieving Factors reposition and motrin          Pelvic Floor Physical  Therapy Evaluation and Assessment  SCREENING  Falls in last 6 mo: No  Interpreter Needed?: No Interpreter Agency:  Interpreter Name  Interpreter ID  Patient Declined Interpreter   Patient signed Ensign waiver    Patient's communication preference:   Red Flags: No Have you had any night sweats? Unexplained weight loss? Saddle anesthesia? Unexplained changes in bowel or bladder habits?  SUBJECTIVE  Patient reports:  Pt reported she does report Riley side goes numb after sitting on toilet for prolonged periods of time-sometimes R side. Pt also states she does sweat at night, which improved after first surgery in 2011. See above for further subjective report.   Precautions:  IUD  Social/Family/Vocational History:   See above    Obstetrical History: Pt has experienced 6 miscarriages.    Gynecological History: Pain with small speculum insertion  Urinary History: Stress incontinence  Gastrointestinal History: Pressure/tightness prior to bowel movement  Sexual activity/pain: Pain during and after   Location of pain: pain moves but most recently R hip and Riley lateral hip/abdomen pain Current pain:  3/10  Max pain:  6/10 Least pain:  0/10 Nature of pain: ache and sharp     OBJECTIVE  Posture/Observations: FHP and rounded shoulders. Sitting: Flexed and sitting edge of chair as pt reports pain with prolonged sitting. Standing: Incr. Lumbar lordosis  Palpation/Segmental Motion/Joint Play:  Hypomobility of lumbar spine  Special tests:   SLS:R and Riley LE- 2-3 sec hold with postural sway noted.   Range of Motion/Flexibilty:  Spine: Pt denied pain with lumbar flex, ext, side bending or rotation. WNL. Hips: Pain with R palpation hip at R iliac crest. B piriformis tightness.   Strength/MMT:  LE MMT  LE MMT Left Right  Hip flex:  (L2) 4/5 4/5  Hip ext: /5 /5  Hip abd: 3/5 3/5  Hip add: /5 /5  Hip IR /5 /5  Hip ER /5 /5   R knee flex: 3+/5 Riley knee flex: 3+/5 R dorsiflex: 3+/5 Riley dorsiflex: 3+/5  Abdominal: Palpation: No pain Diastasis: Negative     Gait Analysis:  Pt noted to have flip flops donned, difficult to assess if shuffling gait from type of shoes. Forward head posture and rounded shoulders noted.  Pelvic Floor Outcome Measures: PFDI pain: 75. PFDI urinary: 42, urinary problems: 53  INTERVENTIONS THIS SESSION: Medbridge HEP: B piriformis stretch 2x 30 sec. Holds Diaphragmatic breathing with cues to improve lateral chest/abdominal   Total time: 61 minutes.       [facility] PT Assessment - 02/25/21 1508      Assessment   Medical Diagnosis Pelvic Floor Dysfunction    Referring Provider (PT) Dr. Sandi Carne    Onset Date/Surgical Date 12/25/20    Hand Dominance Right    Prior Therapy 2013 pelvic health PT with Dr. Danella Sensing      Precautions    Precautions Other (comment)   IUD   Precaution Comments Pt did fall on back when falling out of bed (pt was asleep so unsure if she fell on buttocks).      Restrictions   Weight Bearing Restrictions No      Balance Screen   Has the patient fallen in the past 6 months No    Has the patient had a decrease in activity level because of a fear of falling?  No    Is the patient reluctant to leave their home because of a fear of falling?  No      Home Environment  Living Environment Private residence    Living Arrangements Spouse/significant other    Available Help at Discharge Available PRN/intermittently    Type of Home Mobile home    Home Access Stairs to enter    Entrance Stairs-Number of Steps 5    Entrance Stairs-Rails Can reach both    Home Layout One level    Home Equipment None      Prior Function   Level of Independence Independent    Vocation Full time employment    Vocation Requirements Standing and walking    Leisure Go to the gym                      Objective measurements completed on examination: See above findings.               PT Education - 02/25/21 1626    Education Details PT discussed PT POC, frequency, and duration. PT educated pt on exam findings and initial HEP.    Person(s) Educated Patient    Methods Explanation;Demonstration;Tactile cues;Verbal cues;Handout    Comprehension Returned demonstration;Verbalized understanding            PT Short Term Goals - 02/25/21 1636      PT SHORT TERM GOAL #1   Title Pt will be IND in HEP to improve strength, flexibility and pain. TARGET DATE FOR ALL STGS: 04/01/21    Baseline No HEP    Time 5    Period Weeks    Status New      PT SHORT TERM GOAL #2   Title Pt will report pelvic and LE pain </=4/10 at worst in order to improve QOL and safely perform ADLs.    Baseline 6/10 at worst    Time 5    Period Weeks    Status New      PT SHORT TERM GOAL #3   Title Pt will report she is  able to stand at work for >7 hours without incr. in pain to work safely.    Baseline 6 hours    Time 5    Period Weeks    Status New             PT Long Term Goals - 02/25/21 1638      PT LONG TERM GOAL #1   Title Pt will report </=2/10 pelvic and joint pain at worst while performing ADLs in order to improve QOL. TARGET DATE FOR ALL LTGS: 05/06/21    Baseline 6/10    Time 10    Period Weeks    Status New      PT LONG TERM GOAL #2   Title Pt will report going to the gym 3x/week  in order to maintain gains made during PT.    Baseline 0 times per week    Time 10    Period Weeks    Status New      PT LONG TERM GOAL #3   Title Pt will report zero change of clothes during the week 2/2 urinary leaking.    Baseline 2-3x/week    Time 10    Period Weeks      PT LONG TERM GOAL #4   Title Pt will report 0/10 pain with intercourse and insertion of speculum during GYN exam.    Baseline 6/10    Time 10    Period Weeks    Status New      PT LONG TERM GOAL #5   Title Pt  will improve urinary problem FOTO score to >/= 64 to improve QOL.    Baseline 53    Time 10    Period Weeks    Status New                  Plan - 02/25/21 1534    Clinical Impression Statement Pt is a pleasant 39y/o female presenting to OPPT for pelvic floor dysfunction and pelvic pain s/p 12/2020 laparoscopic procedure to excise endometriosis. Pt's PMH is significant for the following: endometriosis, asthma, ovarian cyst, and seasonal allergies. The following impairments were noted upon exam: decr. strength, impaired flexibility, hypomobility, pain, decr. mobility, postural dysfunction. Pelvic floor musculature not formally assessed this visit but weakness and tightness suspected based on exam and pt history. Pt would benefit from skilled PT to improve pain and mobility during ADLs and iADLS.    Personal Factors and Comorbidities Comorbidity 3+    Comorbidities endometriosis, asthma, ovarian cyst, seasonal  allergies.    Examination-Activity Limitations Continence;Locomotion Level;Stand;Toileting;Carry;Squat    Examination-Participation Restrictions Interpersonal Relationship;Driving;Cleaning;Occupation    Clinical Decision Making Moderate    PT Frequency 1x / week    PT Duration Other (comment)   10 weeks   PT Treatment/Interventions ADLs/Self Care Home Management;Biofeedback;Electrical Stimulation;Gait training;Functional mobility training;Therapeutic activities;Therapeutic exercise;Balance training;Neuromuscular re-education;Manual techniques;Spinal Manipulations;Joint Manipulations;Dry needling;Passive range of motion;Patient/family education    PT Next Visit Plan PT will perform external pelvic floor muscular assessment, assess for LLD and treat as indicated, assesss diaphragm for adhesions and adductors. Review and add to HEP as needed.    PT Home Exercise Plan Medbridge: [email or web address]    Consulted and Agree with Plan of Care Patient           Patient will benefit from skilled therapeutic intervention in order to improve the following deficits and impairments:  Abnormal gait,Decreased range of motion,Decreased strength,Decreased mobility,Postural dysfunction,Impaired flexibility,Improper body mechanics,Pain  Visit Diagnosis: Other lack of coordination - Plan: PT plan of care cert/re-cert  Stress incontinence of urine - Plan: PT plan of care cert/re-cert  Muscle weakness (generalized) - Plan: PT plan of care cert/re-cert  Pain in right hip - Plan: PT plan of care cert/re-cert  Pain in left hip - Plan: PT plan of care cert/re-cert  Pelvic pain - Plan: PT plan of care cert/re-cert     Problem List There are no problems to display for this patient.   Crystal Riley,Crystal Riley 02/25/2021, 4:46 PM  [facility] [address] [place], [place], [address] Phone: [phone]   Fax:  [phone]  Name: Crystal Riley MRN:  [id number] Date of Birth: 04-20-1981  Crystal Riley, PT,DPT 02/25/21 4:52 PM Phone: [phone] Fax: [phone]

## 2021-03-06 ENCOUNTER — Ambulatory Visit: Payer: 59

## 2021-03-10 ENCOUNTER — Ambulatory Visit: Payer: 59

## 2021-03-17 ENCOUNTER — Ambulatory Visit: Payer: 59

## 2021-03-20 ENCOUNTER — Encounter: Payer: 59 | Admitting: Physical Therapy

## 2021-03-31 ENCOUNTER — Ambulatory Visit: Payer: 59 | Attending: Obstetrics & Gynecology

## 2021-03-31 ENCOUNTER — Other Ambulatory Visit: Payer: Self-pay

## 2021-03-31 DIAGNOSIS — M6281 Muscle weakness (generalized): Secondary | ICD-10-CM | POA: Insufficient documentation

## 2021-03-31 DIAGNOSIS — N393 Stress incontinence (female) (male): Secondary | ICD-10-CM | POA: Diagnosis present

## 2021-03-31 DIAGNOSIS — M25552 Pain in left hip: Secondary | ICD-10-CM | POA: Insufficient documentation

## 2021-03-31 DIAGNOSIS — R102 Pelvic and perineal pain: Secondary | ICD-10-CM | POA: Diagnosis present

## 2021-03-31 DIAGNOSIS — R278 Other lack of coordination: Secondary | ICD-10-CM | POA: Diagnosis not present

## 2021-03-31 DIAGNOSIS — M25551 Pain in right hip: Secondary | ICD-10-CM | POA: Diagnosis present

## 2021-03-31 NOTE — Therapy (Signed)
[facility] MAIN [facility] SERVICES [address] [place], [place], [address] Phone: [phone]   Fax:  [phone]  Physical Therapy Treatment  Patient Details  Name: Crystal Riley MRN: [id number] Date of Birth: [birth year] Referring Provider (PT): Dr. Sandi Carne   Encounter Date: 03/31/2021   PT End of Session - 03/31/21 1606    Visit Number 2    Number of Visits 10    Date for PT Re-Evaluation 05/26/21    Authorization Type UHC- 20 PT visits per year: no visits used this year    Authorization - Visit Number 2    Authorization - Number of Visits 20    PT Start Time 1502    PT Stop Time 1557    PT Time Calculation (min) 55 min    Activity Tolerance Patient tolerated treatment well;No increased pain    Behavior During Therapy WFL for tasks assessed/performed           Past Medical History:  Diagnosis Date  . Endometriosis   . Kidney stone   . Medical history non-contributory   . Ovarian cyst     Past Surgical History:  Procedure Laterality Date  . LAPAROSCOPY    . TONSILLECTOMY      There were no vitals filed for this visit.   Subjective Assessment - 03/31/21 1507    Subjective Pt reported she hasn't been performing HEP 2/2 throwing the papers away after logging into Medbridge online and forgot access code to sign onto Medbridge. Pt reported B heel pain upon amb. back to exam room, has been at work since 7am. Pt is wearing body beads to be more aware of posture.    Pertinent History Endometriosis, asthma, seasonal allergies, ovarian cyst    Patient Stated Goals To get back to how I should be, I feel older than I should be. Improve core strength, decr. pain.    Currently in Pain? Yes    Pain Score 7     Pain Location Heel   L is worse than R   Pain Orientation Other (Comment)    Pain Descriptors / Indicators Aching    Pain Type Chronic pain    Pain Onset More than a month ago    Pain Frequency Intermittent    Aggravating  Factors  standing/walking    Pain Relieving Factors sit down and motrin                             [facility] Adult PT Treatment/Exercise - 03/31/21 1511      Ambulation/Gait   Ambulation/Gait Yes    Ambulation/Gait Assistance 7: Independent    Ambulation Distance (Feet) 200 Feet    Assistive device None    Gait Pattern Step-to pattern   L foot shuffle intermittently   Ambulation Surface Indoor      Manual Therapy   Manual Therapy Soft tissue mobilization    Manual therapy comments PT provided trigger point release and massage to L: QL, lats, psoas and piriformis to decr. pain and improve LLD. PT also performed trigger point release to B inf. diaphragm to improve breathing mechanics.    Soft tissue mobilization Pt reported decr. in pain at end of session.         Pt reported 7/10 B heel pain with amb. Prior to session and reported 3/10 B heel pain at end of session with mild pain in ball of R foot.  NMR:  Access Code: [email or web address] URL: [email or web address] Date: 03/31/2021 Prepared by: Zerita Boers  Exercises Supine Figure 4 Piriformis Stretch - 2-3 x daily - 7 x weekly - 1 sets - 3 reps - 30 hold Supine Diaphragmatic Breathing - 1 x daily - 7 x weekly - 3 sets - 10 reps Child's Pose with Sidebending - 1 x daily - 7 x weekly - 1 sets - 3 reps - 30 hold (pt performing in middle and to the R side to lengthen L side)  PT reviewed and progressed HEP and provided cues and demo for proper technique.      Self Care:  PT Education - 03/31/21 1605    Education Details PT discussed diaphragm and attachments to hip flexors. PT educated pt on initial and new HEP activities. PT encouraged pt to attempt 5 intentional breaths while at work to [employer]. tension.    Person(s) Educated Patient    Methods Explanation;Demonstration;Verbal cues;Handout    Comprehension Verbalized understanding;Returned demonstration            PT Short Term Goals - 02/25/21 1636       PT SHORT TERM GOAL #1   Title Pt will be IND in HEP to improve strength, flexibility and pain. TARGET DATE FOR ALL STGS: 04/01/21    Baseline No HEP    Time 5    Period Weeks    Status New      PT SHORT TERM GOAL #2   Title Pt will report pelvic and LE pain </=4/10 at worst in order to improve QOL and safely perform ADLs.    Baseline 6/10 at worst    Time 5    Period Weeks    Status New      PT SHORT TERM GOAL #3   Title Pt will report she is able to stand at work for >7 hours without incr. in pain to work safely.    Baseline 6 hours    Time 5    Period Weeks    Status New             PT Long Term Goals - 02/25/21 1638      PT LONG TERM GOAL #1   Title Pt will report </=2/10 pelvic and joint pain at worst while performing ADLs in order to improve QOL. TARGET DATE FOR ALL LTGS: 05/06/21    Baseline 6/10    Time 10    Period Weeks    Status New      PT LONG TERM GOAL #2   Title Pt will report going to the gym 3x/week  in order to maintain gains made during PT.    Baseline 0 times per week    Time 10    Period Weeks    Status New      PT LONG TERM GOAL #3   Title Pt will report zero change of clothes during the week 2/2 urinary leaking.    Baseline 2-3x/week    Time 10    Period Weeks      PT LONG TERM GOAL #4   Title Pt will report 0/10 pain with intercourse and insertion of speculum during GYN exam.    Baseline 6/10    Time 10    Period Weeks    Status New      PT LONG TERM GOAL #5   Title Pt will improve urinary problem FOTO score to >/= 64 to improve QOL.    Baseline 53  Time 10    Period Weeks    Status New                 Plan - 03/31/21 1607    Clinical Impression Statement Pt demonstrated progress as she reported B heel pain decr. from 7/10 to 3/10 at end of session. Pt also demonstrated improvement in LLD after manual therapy to L quadratus lumborum (QL) and lat. dorsi (LD), as RLE was approx. one thumb width (approx. 1/2") longer  than LLE prior to manual therapy and was B medial malleolus were even at end of session. Pt reported TTP during palpation of B inferior diaphragm, L greater trochanter, L psoas, L QL and LD, with improvement after manual therapy. Pt noted to have barely trace contraction of pelvic floor during external palpation, PT will likely perform internal exam next session to get an accurate MMT measurement. Pt would continue to benefit from skilled PT to improve continence and pain. STGs will be deferred for 2 additional visits, as pt missed PT sessions 2/2 therapist being out sick.    Personal Factors and Comorbidities Comorbidity 3+    Comorbidities endometriosis, asthma, ovarian cyst, seasonal allergies.    Examination-Activity Limitations Continence;Locomotion Level;Stand;Toileting;Carry;Squat    Examination-Participation Restrictions Interpersonal Relationship;Driving;Cleaning;Occupation    PT Frequency 1x / week    PT Duration Other (comment)   10 weeks   PT Treatment/Interventions ADLs/Self Care Home Management;Biofeedback;Electrical Stimulation;Gait training;Functional mobility training;Therapeutic activities;Therapeutic exercise;Balance training;Neuromuscular re-education;Manual techniques;Spinal Manipulations;Joint Manipulations;Dry needling;Passive range of motion;Patient/family education    PT Next Visit Plan PT will perform internal pelvic floor muscular assessment, assess for LLD and treat as indicated. Manual therapy to psoas, adductors, hip ERs, diaphragm, QL and LD as needed. Review HEP prn.    PT Home Exercise Plan Medbridge: [email or web address]    Consulted and Agree with Plan of Care Patient           Patient will benefit from skilled therapeutic intervention in order to improve the following deficits and impairments:  Abnormal gait,Decreased range of motion,Decreased strength,Decreased mobility,Postural dysfunction,Impaired flexibility,Improper body mechanics,Pain  Visit Diagnosis: Other lack of  coordination  Pain in left hip  Pain in right hip  Muscle weakness (generalized)  Stress incontinence of urine  Pelvic pain     Problem List There are no problems to display for this patient.   Ayo Smoak,Ladell L 03/31/2021, 4:21 PM  [facility] MAIN [facility] SERVICES [address] [place], [place], [address] Phone: [phone]   Fax:  [phone]  Name: MEEYA GOLDIN MRN: [id number] Date of Birth: 23-Dec-1980  Zerita Boers, PT,DPT 03/31/21 4:21 PM Phone: [phone] Fax: [phone]

## 2021-04-03 ENCOUNTER — Encounter: Payer: 59 | Admitting: Physical Therapy

## 2021-04-07 ENCOUNTER — Ambulatory Visit: Payer: 59

## 2021-04-07 ENCOUNTER — Other Ambulatory Visit: Payer: Self-pay

## 2021-04-07 DIAGNOSIS — R278 Other lack of coordination: Secondary | ICD-10-CM | POA: Diagnosis not present

## 2021-04-07 DIAGNOSIS — M6281 Muscle weakness (generalized): Secondary | ICD-10-CM

## 2021-04-07 DIAGNOSIS — N393 Stress incontinence (female) (male): Secondary | ICD-10-CM

## 2021-04-07 DIAGNOSIS — M25552 Pain in left hip: Secondary | ICD-10-CM

## 2021-04-07 DIAGNOSIS — R102 Pelvic and perineal pain: Secondary | ICD-10-CM

## 2021-04-07 NOTE — Therapy (Signed)
[place] MAIN [facility] SERVICES [address] [place], [place], [address] Phone: [phone]   Fax:  [phone]  Physical Therapy Treatment  Patient Details  Name: Crystal Riley MRN: [id number] Date of Birth: 05-28-1981 Referring Provider (PT): Dr. Sloan Leiter   Encounter Date: 04/07/2021   PT End of Session - 04/07/21 1617     Visit Number 3    Number of Visits 10    Date for PT Re-Evaluation 05/26/21    Authorization Type U[employer]- 20 PT visits per year: no visits used this year    Authorization - Visit Number 3    Authorization - Number of Visits 20    PT Start Time 1509    PT Stop Time 5732    PT Time Calculation (min) 55 min    Activity Tolerance Patient tolerated treatment well;No increased pain    Behavior During Therapy WFL for tasks assessed/performed             Past Medical History:  Diagnosis Date   Endometriosis    Kidney stone    Medical history non-contributory    Ovarian cyst     Past Surgical History:  Procedure Laterality Date   LAPAROSCOPY     TONSILLECTOMY      There were no vitals filed for this visit.   Subjective Assessment - 04/07/21 1511     Subjective Pt reported she was at a concert Saturday night from 7-12:30am, and was sitting most of that time and had back pain after performing HEP yesterday. B heel pain has been minimal. Pt brought in old exercises. Pt reported no leaking this week.    Pertinent History Endometriosis, asthma, seasonal allergies, ovarian cyst    Patient Stated Goals To get back to how I should be, I feel older than I should be. Improve core strength, decr. pain.    Currently in Pain? Yes    Pain Score --   3-4/10   Pain Location Back    Pain Orientation Left    Pain Descriptors / Indicators Aching    Pain Type Chronic pain    Pain Radiating Towards LLE    Pain Onset Yesterday    Pain Frequency Intermittent    Aggravating Factors  standing all day (has been at work all day)     Pain Relieving Factors pain medication               NMR: Access Code: [email or web address] URL: [email or web address] Date: 04/07/2021 Prepared by: Geoffry Paradise  Exercises Supine Figure 4 Piriformis Stretch - 2-3 x daily - 7 x weekly - 1 sets - 3 reps - 30 hold Supine Diaphragmatic Breathing - 1 x daily - 7 x weekly - 3 sets - 10 reps Child's Pose with Sidebending - 1 x daily - 7 x weekly - 1 sets - 3 reps - 30 hold Supine Pelvic Floor Contraction - 2 x daily - 7 x weekly - 1 sets - 10 reps Hooklying angles with B elbow flexion 1x10 reps after Tx spine mobilizations.  Pt required extensive cues and demo to properly perform HEP, especially pelvic floor contraction with breathing. Pt unable to hold pelvic floor contraction, only perform 1 sec. Hold. Placing hands medial to ASIS assisted pt in contraction TrA with pelvic floor.                  [facility] Adult PT Treatment/Exercise - 04/07/21 1626       Manual Therapy   Manual  Therapy Soft tissue mobilization;Joint mobilization    Manual therapy comments Pt reported 2/10 pain at end of session.    Joint Mobilization PT provided PA mobilizations to T1-12, Grade 3, x30 sec. Followed by hooklying angels with B elbows in flexion to improve Tx ext.    Soft tissue mobilization PT performed massage to L psoas and quad, along with contract/relax with 10 sec. hold x3 reps.                  Self Care:  PT Education - 04/07/21 1615     Education Details PT reviewed and added pelvic floor contraction in hooklying to HEP. PT educated pt on wearing sandals that have a strap around heel, as pt reported she wears flip flops when not at work. PT educated pt on coordinating breathing with pelvic floor contraction.    Person(s) Educated Patient    Methods Explanation;Demonstration;Tactile cues;Verbal cues;Handout    Comprehension Returned demonstration;Verbalized understanding              PT Short Term Goals -  02/25/21 1636       PT SHORT TERM GOAL #1   Title Pt will be IND in HEP to improve strength, flexibility and pain. TARGET DATE FOR ALL STGS: 04/01/21    Baseline No HEP    Time 5    Period Weeks    Status New      PT SHORT TERM GOAL #2   Title Pt will report pelvic and LE pain </=4/10 at worst in order to improve QOL and safely perform ADLs.    Baseline 6/10 at worst    Time 5    Period Weeks    Status New      PT SHORT TERM GOAL #3   Title Pt will report she is able to stand at work for >7 hours without incr. in pain to work safely.    Baseline 6 hours    Time 5    Period Weeks    Status New               PT Long Term Goals - 02/25/21 1638       PT LONG TERM GOAL #1   Title Pt will report </=2/10 pelvic and joint pain at worst while performing ADLs in order to improve QOL. TARGET DATE FOR ALL LTGS: 05/06/21    Baseline 6/10    Time 10    Period Weeks    Status New      PT LONG TERM GOAL #2   Title Pt will report going to the gym 3x/week  in order to maintain gains made during PT.    Baseline 0 times per week    Time 10    Period Weeks    Status New      PT LONG TERM GOAL #3   Title Pt will report zero change of clothes during the week 2/2 urinary leaking.    Baseline 2-3x/week    Time 10    Period Weeks      PT LONG TERM GOAL #4   Title Pt will report 0/10 pain with intercourse and insertion of speculum during GYN exam.    Baseline 6/10    Time 10    Period Weeks    Status New      PT LONG TERM GOAL #5   Title Pt will improve urinary problem FOTO score to >/= 64 to improve QOL.    Baseline 53  Time 10    Period Weeks    Status New                   Plan - 04/07/21 1618     Clinical Impression Statement Pt demonstrated progress as she reported heel pain continues to be minimal and she had zero episodes of leakage since last visit. Pt's L QL and LD tension was noted to be decr. during session as well. However, incr. tension in L quad and  psoas noted with decr. after manual therapy and MET. Pt also reported back pain reduced to 2/10 at end of session. Pt continues to have difficulty coordinating pelvic floor contraction and exhaling/contracting and inhaling/relaxing. PT will perform internal exam next time to effectively perform pelvic floor MMT. Pt would continue to benefit from skilled PT to improve pain, leakage and strength during all ADLS.    Personal Factors and Comorbidities Comorbidity 3+    Comorbidities endometriosis, asthma, ovarian cyst, seasonal allergies.    Examination-Activity Limitations Continence;Locomotion Level;Stand;Toileting;Carry;Squat    Examination-Participation Restrictions Interpersonal Relationship;Driving;Cleaning;Occupation    PT Frequency 1x / week    PT Duration Other (comment)   10 weeks   PT Treatment/Interventions ADLs/Self Care Home Management;Biofeedback;Electrical Stimulation;Gait training;Functional mobility training;Therapeutic activities;Therapeutic exercise;Balance training;Neuromuscular re-education;Manual techniques;Spinal Manipulations;Joint Manipulations;Dry needling;Passive range of motion;Patient/family education    PT Next Visit Plan PT will perform internal pelvic floor muscular assessment, assess for LLD and treat as indicated. Manual therapy to psoas, adductors, hip ERs, diaphragm, QL and LD as needed. Review HEP prn. check STGs?    PT Home Exercise Plan Medbridge: [email or web address]    Consulted and Agree with Plan of Care Patient             Patient will benefit from skilled therapeutic intervention in order to improve the following deficits and impairments:  Abnormal gait, Decreased range of motion, Decreased strength, Decreased mobility, Postural dysfunction, Impaired flexibility, Improper body mechanics, Pain  Visit Diagnosis: Other lack of coordination  Pelvic pain  Pain in left hip  Muscle weakness (generalized)  Stress incontinence of urine     Problem List There  are no problems to display for this patient.   Miller,Maika L 04/07/2021, 4:29 PM  [place] MAIN [facility] SERVICES [address] [place], [place], [address] Phone: [phone]   Fax:  [phone]  Name: Crystal Riley MRN: [id number] Date of Birth: [birth year]   Geoffry Paradise, PT,DPT 04/07/21 4:29 PM Phone: [phone] Fax: [phone]

## 2021-04-14 ENCOUNTER — Other Ambulatory Visit: Payer: Self-pay

## 2021-04-14 ENCOUNTER — Ambulatory Visit: Payer: 59

## 2021-04-14 DIAGNOSIS — M25552 Pain in left hip: Secondary | ICD-10-CM

## 2021-04-14 DIAGNOSIS — N393 Stress incontinence (female) (male): Secondary | ICD-10-CM

## 2021-04-14 DIAGNOSIS — R278 Other lack of coordination: Secondary | ICD-10-CM

## 2021-04-14 DIAGNOSIS — M25551 Pain in right hip: Secondary | ICD-10-CM

## 2021-04-14 DIAGNOSIS — M6281 Muscle weakness (generalized): Secondary | ICD-10-CM

## 2021-04-14 DIAGNOSIS — R102 Pelvic and perineal pain: Secondary | ICD-10-CM

## 2021-04-14 NOTE — Therapy (Signed)
[place] [facility] MAIN [facility] SERVICES [address] [place], [place], [address] Phone: [phone]   Fax:  [phone]  Physical Therapy Treatment  Patient Details  Name: Crystal Riley MRN: [id number] Date of Birth: [birth year] Referring Provider (PT): Dr. Sandi Carne   Encounter Date: 04/14/2021   PT End of Session - 04/14/21 1603     Visit Number 4    Number of Visits 10    Date for PT Re-Evaluation 05/26/21    Authorization Type UHC- 20 PT visits per year: no visits used this year    Authorization - Visit Number 4    Authorization - Number of Visits 20    PT Start Time 1503    PT Stop Time 1559    PT Time Calculation (min) 56 min    Activity Tolerance Patient tolerated treatment well;No increased pain    Behavior During Therapy WFL for tasks assessed/performed             Past Medical History:  Diagnosis Date   Endometriosis    Kidney stone    Medical history non-contributory    Ovarian cyst     Past Surgical History:  Procedure Laterality Date   LAPAROSCOPY     TONSILLECTOMY      There were no vitals filed for this visit.   Subjective Assessment - 04/14/21 1508     Subjective Pt reported she is recovering from sinus infection and the flu, she is now on antibiotics. Pt was treated at [facility]. Pt had urinary leakage with coughing and sneezing. Pt reported a little pain in L hip flexor, likely 2/2 lying around when ill.    Pertinent History Endometriosis, asthma, seasonal allergies, ovarian cyst    Patient Stated Goals To get back to how I should be, I feel older than I should be. Improve core strength, decr. pain.    Currently in Pain? No/denies                            Pelvic Floor Special Questions - 04/14/21 1611     Pelvic Floor Internal Exam Yes    Exam Type Vaginal    Sensation intact    Palpation No introitus pain noted.    Strength weak squeeze, no lift   improved to 3/5 after manual  therapy   Strength # of reps 5    Strength # of seconds 1    Tone incr. tone               [facility] Adult PT Treatment/Exercise - 04/14/21 1613       Manual Therapy   Manual Therapy Other (comment)    Manual therapy comments Pt reported no pain after manual therapy.    Joint Mobilization Long and short R hip distraction 2x30sec. holds.    Soft tissue mobilization PT performed trigger point release to R adductor musculature and short and long distraction to R hip to decr. muscle tension.    Other Manual Therapy Pt noted to experience concordant R groin pain with palpation of R pubococcygeus muscle, which decr. with trigger point manual therapy. Pt also experienced "tightness" with palpation of L OI muscle which decr. with manual therapy. Pt's pelvic floor muscle contraction improved from 2/5 to 3/5 after manual therapy, pt unable to hold contraction for >1 second. Pt performed happy baby pose, child's pose, and quadruped rocking sup/inf motions after manual therapy to relax pelvic floor.  NMR: Pt performed happy baby pose, child's pose, and quadruped rocking sup/inf motions after manual therapy to relax pelvic floor.  Supine pelvic floor contractions with breathing x10 reps. Cues to ensure proper technique for exhale and lift up and inhale/relax PF muscles.         PT Education - 04/14/21 1602     Education Details PT demonstrated pelvic floor relaxation techniques to maintain gains made during session. Pt noted to wear crocs with heel strap today. PT encouraged to start HEP again as she is feeling better and that we would not progress to pelvic contractions with holds 2/2 pelvic pain, tension, and weakness. PT discussed findings during internal exam.    Person(s) Educated Patient    Methods Explanation;Demonstration;Verbal cues    Comprehension Verbalized understanding;Returned demonstration;Need further instruction              PT Short Term Goals - 02/25/21  1636       PT SHORT TERM GOAL #1   Title Pt will be IND in HEP to improve strength, flexibility and pain. TARGET DATE FOR ALL STGS: 04/01/21    Baseline No HEP    Time 5    Period Weeks    Status New      PT SHORT TERM GOAL #2   Title Pt will report pelvic and LE pain </=4/10 at worst in order to improve QOL and safely perform ADLs.    Baseline 6/10 at worst    Time 5    Period Weeks    Status New      PT SHORT TERM GOAL #3   Title Pt will report she is able to stand at work for >7 hours without incr. in pain to work safely.    Baseline 6 hours    Time 5    Period Weeks    Status New               PT Long Term Goals - 02/25/21 1638       PT LONG TERM GOAL #1   Title Pt will report </=2/10 pelvic and joint pain at worst while performing ADLs in order to improve QOL. TARGET DATE FOR ALL LTGS: 05/06/21    Baseline 6/10    Time 10    Period Weeks    Status New      PT LONG TERM GOAL #2   Title Pt will report going to the gym 3x/week  in order to maintain gains made during PT.    Baseline 0 times per week    Time 10    Period Weeks    Status New      PT LONG TERM GOAL #3   Title Pt will report zero change of clothes during the week 2/2 urinary leaking.    Baseline 2-3x/week    Time 10    Period Weeks      PT LONG TERM GOAL #4   Title Pt will report 0/10 pain with intercourse and insertion of speculum during GYN exam.    Baseline 6/10    Time 10    Period Weeks    Status New      PT LONG TERM GOAL #5   Title Pt will improve urinary problem FOTO score to >/= 64 to improve QOL.    Baseline 53    Time 10    Period Weeks    Status New  Plan - 04/14/21 1604     Clinical Impression Statement PT performed internal pelvic floor muscular assessment today. Pt noted to experience concordant R groin pain with palpation of R pubococcygeus muscle, which decr. with trigger point manual therapy. Pt also experience "tightness" with palpation of  L OI muscle which decr. with manual therapy. Pt's pelvic floor muscle contraction improved from 2/5 to 3/5 after manual therapy, pt unable to hold contraction for >1 second. Pt continues to require cues to coordinate breath with pelvic floor contraction and experiences SUI. Pt would continue to benefit from skilled PT to improve incontinence, pain, and strength during all ADLs.    Personal Factors and Comorbidities Comorbidity 3+    Comorbidities endometriosis, asthma, ovarian cyst, seasonal allergies.    Examination-Activity Limitations Continence;Locomotion Level;Stand;Toileting;Carry;Squat    Examination-Participation Restrictions Interpersonal Relationship;Driving;Cleaning;Occupation    PT Frequency 1x / week    PT Duration Other (comment)   10 weeks   PT Treatment/Interventions ADLs/Self Care Home Management;Biofeedback;Electrical Stimulation;Gait training;Functional mobility training;Therapeutic activities;Therapeutic exercise;Balance training;Neuromuscular re-education;Manual techniques;Spinal Manipulations;Joint Manipulations;Dry needling;Passive range of motion;Patient/family education    PT Next Visit Plan Assess STGs. Continue internal pelvic floor muscular manual therapy, assess for LLD and treat as indicated. Manual therapy to psoas, adductors, hip ERs, diaphragm, QL and LD as needed. Review HEP prn.    PT Home Exercise Plan Medbridge: [email or web address]    Consulted and Agree with Plan of Care Patient             Patient will benefit from skilled therapeutic intervention in order to improve the following deficits and impairments:  Abnormal gait, Decreased range of motion, Decreased strength, Decreased mobility, Postural dysfunction, Impaired flexibility, Improper body mechanics, Pain  Visit Diagnosis: Pelvic pain  Muscle weakness (generalized)  Stress incontinence of urine  Other lack of coordination  Pain in right hip  Pain in left hip     Problem List There are no problems  to display for this patient.   Camiah Humm,Doralene L 04/14/2021, 4:16 PM  [place] [facility] MAIN [facility] SERVICES [address] [place], [place], [address] Phone: [phone]   Fax:  [phone]  Name: TAKIYAH BOHNSACK MRN: [id number] Date of Birth: 12-04-1980  Zerita Boers, PT,DPT 04/14/21 4:18 PM Phone: [phone] Fax: [phone]

## 2021-04-17 ENCOUNTER — Encounter: Payer: 59 | Admitting: Physical Therapy

## 2021-04-21 ENCOUNTER — Ambulatory Visit: Payer: 59

## 2021-04-21 ENCOUNTER — Other Ambulatory Visit: Payer: Self-pay

## 2021-04-21 DIAGNOSIS — N393 Stress incontinence (female) (male): Secondary | ICD-10-CM

## 2021-04-21 DIAGNOSIS — R278 Other lack of coordination: Secondary | ICD-10-CM | POA: Diagnosis not present

## 2021-04-21 DIAGNOSIS — R102 Pelvic and perineal pain: Secondary | ICD-10-CM

## 2021-04-21 DIAGNOSIS — M6281 Muscle weakness (generalized): Secondary | ICD-10-CM

## 2021-04-21 DIAGNOSIS — M25551 Pain in right hip: Secondary | ICD-10-CM

## 2021-04-21 NOTE — Therapy (Signed)
[place] MAIN [facility] SERVICES [address] [place], [place], [address] Phone: [phone]   Fax:  [phone]  Physical Therapy Treatment  Patient Details  Name: Crystal Riley MRN: [id number] Date of Birth: 1981-04-15 Referring Provider (PT): Dr. Sloan Leiter   Encounter Date: 04/21/2021   PT End of Session - 04/21/21 1608     Visit Number 5    Number of Visits 10    Date for PT Re-Evaluation 05/26/21    Authorization Type UHC- 20 PT visits per year: no visits used this year    Authorization - Visit Number 5    Authorization - Number of Visits 20    Progress Note Due on Visit 1505    PT Start Time 1505    PT Stop Time 1601    PT Time Calculation (min) 56 min    Activity Tolerance Patient tolerated treatment well;No increased pain    Behavior During Therapy WFL for tasks assessed/performed             Past Medical History:  Diagnosis Date   Endometriosis    Kidney stone    Medical history non-contributory    Ovarian cyst     Past Surgical History:  Procedure Laterality Date   LAPAROSCOPY     TONSILLECTOMY      There were no vitals filed for this visit.   Subjective Assessment - 04/21/21 1511     Subjective Pt reported R heel pain is gone but she now has pain in ball of foot and L heel pain is still present. Pt reported leakage is good but she's having pelvic pain with intercourse and R sided hip flexor pain which subsided with compression (a couple minutes). Pt reported she only had to change clothes once last week 2/2 leaking.    Pertinent History Endometriosis, asthma, seasonal allergies, ovarian cyst    Patient Stated Goals To get back to how I should be, I feel older than I should be. Improve core strength, decr. pain.    Currently in Pain? Yes    Pain Score 2     Pain Location Pelvis    Pain Orientation Left;Right    Pain Descriptors / Indicators Aching    Pain Type Chronic pain    Pain Onset Yesterday    Pain  Frequency Intermittent    Aggravating Factors  intercourse and certain positions                               [facility] Adult PT Treatment/Exercise - 04/21/21 1605       Manual Therapy   Manual Therapy Soft tissue mobilization;Internal Pelvic Floor    Manual therapy comments Pt reported no pain after manual therapy. No LLD noted after manual therapy.    Soft tissue mobilization PT performed massage and trigger point release to R psoas and R TFL musculature.    Internal Pelvic Floor PT performed manual therapy and trigger point release to B levator ani and OI musculature to decr. tension and pain. PT instructed pt to focus on breath to relax pelvic floor.                  SELF CARE:  PT Education - 04/21/21 1607     Education Details PT discussed goal progress and to continue HEP and relaxation technique to decr. pain. PT will assess gait next session, as pt reported R heel pain is gone  but now has pain in R ball of foot and toes, pt continues to experience L heel pain. PT asked pt to write down when she experience B foot pain and activities prior to pain.    Person(s) Educated Patient    Methods Explanation    Comprehension Verbalized understanding           PT also educated pt on how pelvic floor musculature can cause referred back and hip pain, using diagram.   PT Short Term Goals - 04/21/21 1540       PT SHORT TERM GOAL #1   Title Pt will be IND in HEP to improve strength, flexibility and pain. TARGET DATE FOR ALL STGS: 04/01/21    Baseline No HEP. Partially met on 04/21/21 as she's performing 3-4x/week vs. daily.    Time 5    Period Weeks    Status Partially Met      PT SHORT TERM GOAL #2   Title Pt will report pelvic and LE pain </=4/10 at worst in order to improve QOL and safely perform ADLs.    Baseline 6/10 at worst    Time 5    Period Weeks    Status Not Met      PT SHORT TERM GOAL #3   Title Pt will report she is able to stand at work  for >7 hours without incr. in pain to work safely.    Baseline 6 hours (some days she can stand the entire 8 hours, some times approx. 4 hours: pt can do approx. 2 days per week at 8 hours)    Time 5    Period Weeks    Status Partially Met               PT Long Term Goals - 02/25/21 1638       PT LONG TERM GOAL #1   Title Pt will report </=2/10 pelvic and joint pain at worst while performing ADLs in order to improve QOL. TARGET DATE FOR ALL LTGS: 05/06/21    Baseline 6/10    Time 10    Period Weeks    Status New      PT LONG TERM GOAL #2   Title Pt will report going to the gym 3x/week  in order to maintain gains made during PT.    Baseline 0 times per week    Time 10    Period Weeks    Status New      PT LONG TERM GOAL #3   Title Pt will report zero change of clothes during the week 2/2 urinary leaking.    Baseline 2-3x/week    Time 10    Period Weeks      PT LONG TERM GOAL #4   Title Pt will report 0/10 pain with intercourse and insertion of speculum during GYN exam.    Baseline 6/10    Time 10    Period Weeks    Status New      PT LONG TERM GOAL #5   Title Pt will improve urinary problem FOTO score to >/= 64 to improve QOL.    Baseline 53    Time 10    Period Weeks    Status New                   Plan - 04/21/21 1609     Clinical Impression Statement Pt demonstrated progress as she partially met STGs 1 and 3, pt did not meet STG  2. Pt also reported only having to change clothes once last week 2/2 urinary incontinence vs. 2-3x/week. Pt demonstrated progress during session as she reported no pain after manual therapy. No LLD noted during today's session.  Although pain and incontinence have improved since starting PT, pt continues to have pain with intercourse, during prolonged standing, and intermittent incontinence. Therefore, pt would continue to benefit from skilled PT to improve pain, leakage and strength during work and all ADLs.    Personal  Factors and Comorbidities Comorbidity 3+    Comorbidities endometriosis, asthma, ovarian cyst, seasonal allergies.    Examination-Activity Limitations Continence;Locomotion Level;Stand;Toileting;Carry;Squat    Examination-Participation Restrictions Interpersonal Relationship;Driving;Cleaning;Occupation    PT Frequency 1x / week    PT Duration Other (comment)   10 weeks   PT Treatment/Interventions ADLs/Self Care Home Management;Biofeedback;Electrical Stimulation;Gait training;Functional mobility training;Therapeutic activities;Therapeutic exercise;Balance training;Neuromuscular re-education;Manual techniques;Spinal Manipulations;Joint Manipulations;Dry needling;Passive range of motion;Patient/family education    PT Next Visit Plan Continue internal pelvic floor muscular manual therapy, assess for LLD and treat as indicated. Manual therapy to psoas, TFL, adductors, hip ERs, diaphragm, QL and LD as needed. Progress HEP prn.    PT Home Exercise Plan Medbridge: [email or web address]    Consulted and Agree with Plan of Care Patient             Patient will benefit from skilled therapeutic intervention in order to improve the following deficits and impairments:  Abnormal gait, Decreased range of motion, Decreased strength, Decreased mobility, Postural dysfunction, Impaired flexibility, Improper body mechanics, Pain  Visit Diagnosis: Pelvic pain  Muscle weakness (generalized)  Stress incontinence of urine  Other lack of coordination  Pain in right hip     Problem List There are no problems to display for this patient.   Treyce Spillers,Tahnee L 04/21/2021, 4:14 PM  [place] MAIN [facility] SERVICES [address] [place], [place], [address] Phone: [phone]   Fax:  [phone]  Name: Crystal Riley MRN: [id number] Date of Birth: 1981/06/21   Geoffry Paradise, PT,DPT 04/21/21 4:15 PM Phone: [phone] Fax: [phone]

## 2021-04-24 ENCOUNTER — Encounter: Payer: 59 | Admitting: Physical Therapy

## 2021-05-05 ENCOUNTER — Other Ambulatory Visit: Payer: Self-pay

## 2021-05-05 ENCOUNTER — Ambulatory Visit: Payer: 59 | Attending: Obstetrics & Gynecology

## 2021-05-05 DIAGNOSIS — N393 Stress incontinence (female) (male): Secondary | ICD-10-CM | POA: Insufficient documentation

## 2021-05-05 DIAGNOSIS — M25551 Pain in right hip: Secondary | ICD-10-CM | POA: Insufficient documentation

## 2021-05-05 DIAGNOSIS — R102 Pelvic and perineal pain: Secondary | ICD-10-CM | POA: Insufficient documentation

## 2021-05-05 DIAGNOSIS — M25552 Pain in left hip: Secondary | ICD-10-CM | POA: Insufficient documentation

## 2021-05-05 DIAGNOSIS — M6281 Muscle weakness (generalized): Secondary | ICD-10-CM | POA: Insufficient documentation

## 2021-05-05 DIAGNOSIS — R278 Other lack of coordination: Secondary | ICD-10-CM | POA: Diagnosis present

## 2021-05-05 NOTE — Therapy (Signed)
[place] MAIN [facility] SERVICES [address] [place], [place], [address] Phone: [phone]   Fax:  [phone]  Physical Therapy Treatment  Patient Details  Name: Crystal Riley MRN: [id number] Date of Birth: 03-19-1981 Referring Provider (PT): Dr. Sloan Leiter   Encounter Date: 05/05/2021   PT End of Session - 05/05/21 1615     Visit Number 6    Number of Visits 10    Date for PT Re-Evaluation 05/26/21    Authorization Type [employer]- 20 PT visits per year: no visits used this year    Authorization - Visit Number 6    Authorization - Number of Visits 20    PT Start Time 8466   front desk having check in issues   PT Stop Time 5993    PT Time Calculation (min) 60 min    Activity Tolerance Patient tolerated treatment well;No increased pain    Behavior During Therapy WFL for tasks assessed/performed             Past Medical History:  Diagnosis Date   Endometriosis    Kidney stone    Medical history non-contributory    Ovarian cyst     Past Surgical History:  Procedure Laterality Date   LAPAROSCOPY     TONSILLECTOMY      There were no vitals filed for this visit.   Subjective Assessment - 05/05/21 1508     Subjective Pt reported she had one urinary incontinence episode and one bowel incontinece episode. She ate something that upset her stomach. Pt reported she hasn't had a lot of pain in pelvic/hip area. Pt is experiencing R and L foot pain, 5/10-R is worse than L foot (more so on ball of foot). Pt noticed her heels were painful after driving one hour and back to a [place] in [place]. She also notice R ankle was swollen after work last week. Pt said exercises are going well except for the child's pose.    Pertinent History Endometriosis, asthma, seasonal allergies, ovarian cyst    Patient Stated Goals To get back to how I should be, I feel older than I should be. Improve core strength, decr. pain.    Currently in Pain? Yes    Pain Score  5     Pain Location Foot    Pain Orientation Left;Right    Pain Descriptors / Indicators Aching    Pain Type Chronic pain    Pain Onset More than a month ago    Pain Frequency Intermittent    Aggravating Factors  sitting for more than 15 minutes, standing for long periods of time    Pain Relieving Factors switch positions                       NMR: Access Code: [email or web address] URL: [email or web address] Date: 05/05/2021 Prepared by: Geoffry Paradise  Exercises Supine Figure 4 Piriformis Stretch - 2-3 x daily - 7 x weekly - 1 sets - 3 reps - 30 hold with ankle stretch included and massage to foot arch (B). Supine Diaphragmatic Breathing - 1 x daily - 7 x weekly - 3 sets - 10 reps (performed with pelvic floor contractions) Child's Pose with Sidebending - 1 x daily - 7 x weekly - 1 sets - 3 reps - 30 hold, no sidebending performing today. Modified with pt on forearms and pillow placed under arms to rest pt's head on pillow to reduce stress on shoulders.  Supine Pelvic Floor Contraction -  2 x daily - 7 x weekly - 1 sets - 10 reps, added 3 second hold x5 reps.  PT provided cues for pt to find B ASIS, move one inch medially in order to feel TrA contraction when contracting pelvic floor in order to be able to tell when PF is contraction, as pt reported difficulty knowing when she was contracting PF.   Discussed trying different exercise programs (eventually) at home such as yoga, barre, pilates to focus on breathing, core and hip strength. Pt likes to lift weights at the gym but boyfriend would like to get a home gym.         [place] Adult PT Treatment/Exercise - 05/05/21 1607       Posture/Postural Control   Posture/Postural Control Postural limitations    Posture Comments Pt noted to experience incr. B foot pronation in stance with decr. arch.      Exercises   Exercises Ankle      Manual Therapy   Manual Therapy Joint mobilization;Other (comment)   stretch    Manual therapy comments Pt reported B foot pain decr. from 5/10 to 3/10 after manual therapy. Pt reported she felt concordant pelvic pain in L groin during initial dorsiflexion stretch which subsided.    Joint Mobilization Proximal tib/fib joint and distal talocural joint, PA joint mobs to R tib/fib joints 1x30sec./joint, distraction to B feet 2x30sec. holds. Followed by dorsiflexion stretch to B feet 3x30 second holds.    Other Manual Therapy PT assessed B feet for toe ext/flexion, metatarsal ROM and dorsiflexion.      Ankle Exercises: Stretches   Plantar Fascia Stretch 2 reps;30 seconds    Plantar Fascia Stretch Limitations with massage to fascia in seated position.    Gastroc Stretch 1 rep;30 seconds    Gastroc Stretch Limitations B feet    Other Stretch PT added these stretches to HEP. Pt can also perform gastroc stretch on steps.                    PT Education - 05/05/21 1615     Education Details PT made modifications to current HEP and added foot stretches to HEP for B foot pain. PT discussed the importance of wearing shoes with heel strap to decr. B foot pain and pelvic pain.    Person(s) Educated Patient    Methods Explanation;Demonstration;Handout;Verbal cues;Tactile cues    Comprehension Verbalized understanding;Returned demonstration;Tactile cues required              PT Short Term Goals - 04/21/21 1540       PT SHORT TERM GOAL #1   Title Pt will be IND in HEP to improve strength, flexibility and pain. TARGET DATE FOR ALL STGS: 04/01/21    Baseline No HEP. Partially met on 04/21/21 as she's performing 3-4x/week vs. daily.    Time 5    Period Weeks    Status Partially Met      PT SHORT TERM GOAL #2   Title Pt will report pelvic and LE pain </=4/10 at worst in order to improve QOL and safely perform ADLs.    Baseline 6/10 at worst    Time 5    Period Weeks    Status Not Met      PT SHORT TERM GOAL #3   Title Pt will report she is able to stand at work for  >7 hours without incr. in pain to work safely.    Baseline 6 hours (some days she can  stand the entire 8 hours, some times approx. 4 hours: pt can do approx. 2 days per week at 8 hours)    Time 5    Period Weeks    Status Partially Met               PT Long Term Goals - 05/05/21 1617       PT LONG TERM GOAL #1   Title Pt will report </=2/10 pelvic and joint pain at worst while performing ADLs in order to improve QOL. TARGET DATE FOR ALL LTGS: 05/06/21 (originally, now 06/06/21 2/2 missed appts)    Baseline 6/10    Time 10    Period Weeks    Status New      PT LONG TERM GOAL #2   Title Pt will report going to the gym 3x/week  in order to maintain gains made during PT.    Baseline 0 times per week    Time 10    Period Weeks    Status New      PT LONG TERM GOAL #3   Title Pt will report zero change of clothes during the week 2/2 urinary leaking.    Baseline 2-3x/week    Time 10    Period Weeks      PT LONG TERM GOAL #4   Title Pt will report 0/10 pain with intercourse and insertion of speculum during GYN exam.    Baseline 6/10    Time 10    Period Weeks    Status New      PT LONG TERM GOAL #5   Title Pt will improve urinary problem FOTO score to >/= 64 to improve QOL.    Baseline 53    Time 10    Period Weeks    Status New                   Plan - 05/05/21 1616     Clinical Impression Statement Pt demonstrated progress as she reported pelvic pain overall has decr. to 3/10 on average. Today's skilled session focused on improving pt's ability to perform endurance pelvic floor contraction and exhaling during pelvic floor contraction to improve strength. Pt demonstrated progress as she was able to hold contraction for 3 seconds vs. 1 second. Pt continues to require cues for proper technique of exercises and experience leakage and pain. PT assessed pt's B foot today 2/2 c/o pain and foot issues can contribute to pelvic floor pain. Pt's B ankle dorsiflexion was  limited (R>L) but improved with manual therapy and foot pain decr. to 3/10 at end of session. Therefore, pt would continue to benefit from skilled PT to improve pain, incontinence, weakness and ankle/foot ROM. Pt's LTGs pushed out to early August 2/2 pt missing appt's 2/2 illness and PT was out.    Personal Factors and Comorbidities Comorbidity 3+    Comorbidities endometriosis, asthma, ovarian cyst, seasonal allergies.    Examination-Activity Limitations Continence;Locomotion Level;Stand;Toileting;Carry;Squat    Examination-Participation Restrictions Interpersonal Relationship;Driving;Cleaning;Occupation    PT Frequency 1x / week    PT Duration Other (comment)   10 weeks   PT Treatment/Interventions ADLs/Self Care Home Management;Biofeedback;Electrical Stimulation;Gait training;Functional mobility training;Therapeutic activities;Therapeutic exercise;Balance training;Neuromuscular re-education;Manual techniques;Spinal Manipulations;Joint Manipulations;Dry needling;Passive range of motion;Patient/family education    PT Next Visit Plan Ask about posture when toileting. Continue B foot manual therapy and exercises, gait training. Continue internal pelvic floor muscular manual therapy prn. assess for LLD and treat as indicated. Manual therapy to psoas, TFL, adductors,  hip ERs, diaphragm, QL and LD as needed. Progress HEP prn.    PT Home Exercise Plan Medbridge: [email or web address]    Consulted and Agree with Plan of Care Patient             Patient will benefit from skilled therapeutic intervention in order to improve the following deficits and impairments:  Abnormal gait, Decreased range of motion, Decreased strength, Decreased mobility, Postural dysfunction, Impaired flexibility, Improper body mechanics, Pain  Visit Diagnosis: Pelvic pain  Muscle weakness (generalized)  Stress incontinence of urine  Other lack of coordination     Problem List There are no problems to display for this  patient.   Shenicka Sunderlin,Sl[place] L 05/05/2021, 4:24 PM  [place] MAIN [facility] SERVICES [address] [place], [place], [address] Phone: [phone]   Fax:  [phone]  Name: GRETA YUNG MRN: [id number] Date of Birth: 01-22-1981  Geoffry Paradise, PT,DPT 05/05/21 4:28 PM Phone: [phone] Fax: [phone]

## 2021-05-12 ENCOUNTER — Other Ambulatory Visit: Payer: Self-pay

## 2021-05-12 ENCOUNTER — Ambulatory Visit: Payer: 59

## 2021-05-12 DIAGNOSIS — M25551 Pain in right hip: Secondary | ICD-10-CM

## 2021-05-12 DIAGNOSIS — M6281 Muscle weakness (generalized): Secondary | ICD-10-CM

## 2021-05-12 DIAGNOSIS — N393 Stress incontinence (female) (male): Secondary | ICD-10-CM

## 2021-05-12 DIAGNOSIS — R102 Pelvic and perineal pain: Secondary | ICD-10-CM | POA: Diagnosis not present

## 2021-05-12 DIAGNOSIS — R278 Other lack of coordination: Secondary | ICD-10-CM

## 2021-05-12 DIAGNOSIS — M25552 Pain in left hip: Secondary | ICD-10-CM

## 2021-05-12 NOTE — Therapy (Signed)
[place] MAIN [facility] SERVICES [address] [place], [place], [address] Phone: [phone]   Fax:  [phone]  Physical Therapy Treatment  Patient Details  Name: Crystal Riley MRN: [id number] Date of Birth: 1981-09-06 Referring Provider (PT): Dr. Sloan Leiter   Encounter Date: 05/12/2021   PT End of Session - 05/12/21 1606     Visit Number 7    Number of Visits 10    Date for PT Re-Evaluation 05/26/21    Authorization Type U[employer]- 20 PT visits per year: no visits used this year    Authorization - Visit Number 7    Authorization - Number of Visits 20    Progress Note Due on Visit 10    PT Start Time 1505    PT Stop Time 1600    PT Time Calculation (min) 55 min    Activity Tolerance Patient tolerated treatment well;No increased pain    Behavior During Therapy WFL for tasks assessed/performed             Past Medical History:  Diagnosis Date   Endometriosis    Kidney stone    Medical history non-contributory    Ovarian cyst     Past Surgical History:  Procedure Laterality Date   LAPAROSCOPY     TONSILLECTOMY      There were no vitals filed for this visit.   Subjective Assessment - 05/12/21 1507     Subjective Pt reported she was in the pool over the weekend but it didn't really help her feet. Pt was able to stop leakage during a cough last night but did not stop leakage on Friday. Pt said on average pelvic pain 3-6/10, foot pain is the same. Pt was sore Tuesday after last session on Monday, pt was changing tire and she was sore the next day. Pt started gastroc stretch on steps (only every other two days). Pt reported she had intercourse last night and woke up with 2/10 pelvic pain.    Pertinent History Endometriosis, asthma, seasonal allergies, ovarian cyst    Patient Stated Goals To get back to how I should be, I feel older than I should be. Improve core strength, decr. pain.    Currently in Pain? Yes    Pain Score 2      Pain Location Pelvis    Pain Orientation --   center   Pain Descriptors / Indicators Aching    Pain Type Chronic pain    Pain Onset Today    Pain Frequency Intermittent    Aggravating Factors  intercourse    Pain Relieving Factors Motrin              THEREX: Access Code: [email or web address] URL: [email or web address] Date: 05/12/2021 Prepared by: Geoffry Paradise  Gastroc Stretch on Wall - 2-3 x daily - 7 x weekly - 1 sets - 3 reps - 30 hold Gastroc Stretch with Foot at Wall - 2-3 x daily - 7 x weekly - 1 sets - 3 reps - 30 hold Standing Soleus Stretch - 1 x daily - 7 x weekly - 3 sets - 10 reps  Cues and demo for proper technique. Performed with S for safety and on BLE.                [facility] Adult PT Treatment/Exercise - 05/12/21 1602       Exercises   Exercises Ankle      Manual Therapy   Manual Therapy Joint mobilization;Soft tissue mobilization  Manual therapy comments Pt reported 2/10 B foot pain and 0/10 pelvic pain after manual therapy.    Joint Mobilization Proximal tib/fib joint and distal talocural joint, PA joint mobs to R and L tib/fib joints 1x30sec./joint, distraction to B feet 2x30sec. holds. Followed by dorsiflexion stretch to B feet 3x30 second holds.    Soft tissue mobilization PT performed massage and trigger point release to B gastroc/soleus musculature.      Ankle Exercises: Stretches   Soleus Stretch 20 seconds;2 reps    Soleus Stretch Limitations Cues and demo for technique. See HEP for details. B LE    Gastroc Stretch 3 reps;30 seconds    Gastroc Stretch Limitations B LE, cues and demo for technique. Two different positions in standing: see HEP for details.                  SELF CARE:  PT Education - 05/12/21 1605     Education Details PT added B gastroc/soleus stretches to HEP to maintain gains/decr. pain. PT educated pt on using foam roller and tennis ball to arch of foot and calves to decr. trigger points. PT  educated pt on the importance of toileting posture to relax pelvic floor during bowel movement and fully empty bladder.    Person(s) Educated Patient    Methods Explanation;Demonstration;Tactile cues;Verbal cues;Handout    Comprehension Returned demonstration;Verbalized understanding;Need further instruction              PT Short Term Goals - 04/21/21 1540       PT SHORT TERM GOAL #1   Title Pt will be IND in HEP to improve strength, flexibility and pain. TARGET DATE FOR ALL STGS: 04/01/21    Baseline No HEP. Partially met on 04/21/21 as she's performing 3-4x/week vs. daily.    Time 5    Period Weeks    Status Partially Met      PT SHORT TERM GOAL #2   Title Pt will report pelvic and LE pain </=4/10 at worst in order to improve QOL and safely perform ADLs.    Baseline 6/10 at worst    Time 5    Period Weeks    Status Not Met      PT SHORT TERM GOAL #3   Title Pt will report she is able to stand at work for >7 hours without incr. in pain to work safely.    Baseline 6 hours (some days she can stand the entire 8 hours, some times approx. 4 hours: pt can do approx. 2 days per week at 8 hours)    Time 5    Period Weeks    Status Partially Met               PT Long Term Goals - 05/05/21 1617       PT LONG TERM GOAL #1   Title Pt will report </=2/10 pelvic and joint pain at worst while performing ADLs in order to improve QOL. TARGET DATE FOR ALL LTGS: 05/06/21 (originally, now 06/06/21 2/2 missed appts)    Baseline 6/10    Time 10    Period Weeks    Status New      PT LONG TERM GOAL #2   Title Pt will report going to the gym 3x/week  in order to maintain gains made during PT.    Baseline 0 times per week    Time 10    Period Weeks    Status New  PT LONG TERM GOAL #3   Title Pt will report zero change of clothes during the week 2/2 urinary leaking.    Baseline 2-3x/week    Time 10    Period Weeks      PT LONG TERM GOAL #4   Title Pt will report 0/10 pain with  intercourse and insertion of speculum during GYN exam.    Baseline 6/10    Time 10    Period Weeks    Status New      PT LONG TERM GOAL #5   Title Pt will improve urinary problem FOTO score to >/= 64 to improve QOL.    Baseline 53    Time 10    Period Weeks    Status New                   Plan - 05/12/21 1607     Clinical Impression Statement Today's skilled session continued to focus on improving pt's B foot ROM and flexilibilty in order to decr. B foot pain, as it can impact pelvic floor musculature and pain. Pt demonstrated progress as she reported 0/10 pelvic pain (from 2/10 at beginning of session) and 2/10 B foot pain vs. 4/10 at beginning of session. Pt would continue to benefit from skilled PT to improve pain, leakage, strength, ROM and flexibility during all ADLs.    Personal Factors and Comorbidities Comorbidity 3+    Comorbidities endometriosis, asthma, ovarian cyst, seasonal allergies.    Examination-Activity Limitations Continence;Locomotion Level;Stand;Toileting;Carry;Squat    Examination-Participation Restrictions Interpersonal Relationship;Driving;Cleaning;Occupation    PT Frequency 1x / week    PT Duration Other (comment)   10 weeks   PT Treatment/Interventions ADLs/Self Care Home Management;Biofeedback;Electrical Stimulation;Gait training;Functional mobility training;Therapeutic activities;Therapeutic exercise;Balance training;Neuromuscular re-education;Manual techniques;Spinal Manipulations;Joint Manipulations;Dry needling;Passive range of motion;Patient/family education    PT Next Visit Plan Continue B foot manual therapy and exercises, gait training. Continue internal pelvic floor muscular manual therapy prn. assess for LLD and treat as indicated. Manual therapy to psoas, TFL, adductors, hip ERs, diaphragm, QL and LD as needed. Progress HEP prn.    PT Home Exercise Plan Medbridge: [email or web address]    Consulted and Agree with Plan of Care Patient              Patient will benefit from skilled therapeutic intervention in order to improve the following deficits and impairments:  Abnormal gait, Decreased range of motion, Decreased strength, Decreased mobility, Postural dysfunction, Impaired flexibility, Improper body mechanics, Pain  Visit Diagnosis: Pelvic pain  Other lack of coordination  Stress incontinence of urine  Muscle weakness (generalized)  Pain in right hip  Pain in left hip     Problem List There are no problems to display for this patient.   Lorann Tani,Wilsie L 05/12/2021, 4:10 PM  [place] MAIN [facility] SERVICES [address] [place], [place], [address] Phone: [phone]   Fax:  [phone]  Name: JUNITA KUBOTA MRN: [id number] Date of Birth: 1981-08-23   Geoffry Paradise, PT,DPT 05/12/21 4:11 PM Phone: [phone] Fax: [phone]

## 2021-05-12 NOTE — Patient Instructions (Signed)
Toileting posture:  1) Peeing posture: feet flat, lean forward and rest forearms on legs to relax and empty bladder  2) Bowel movements: feet flat on squatty potty (or elevated surface so knees are higher than hips), lean forward and rest forearms on legs.  

## 2021-05-19 ENCOUNTER — Ambulatory Visit: Payer: 59

## 2021-05-19 ENCOUNTER — Other Ambulatory Visit: Payer: Self-pay

## 2021-05-19 DIAGNOSIS — R278 Other lack of coordination: Secondary | ICD-10-CM

## 2021-05-19 DIAGNOSIS — N393 Stress incontinence (female) (male): Secondary | ICD-10-CM

## 2021-05-19 DIAGNOSIS — M6281 Muscle weakness (generalized): Secondary | ICD-10-CM

## 2021-05-19 DIAGNOSIS — R102 Pelvic and perineal pain: Secondary | ICD-10-CM | POA: Diagnosis not present

## 2021-05-19 DIAGNOSIS — M25551 Pain in right hip: Secondary | ICD-10-CM

## 2021-05-19 NOTE — Therapy (Addendum)
[place] MAIN [facility] SERVICES [address] [place], [place], [address] Phone: [phone]   Fax:  [phone]  Physical Therapy Treatment  Patient Details  Name: Crystal Riley MRN: [id number] Date of Birth: 05-Feb-1981 Referring Provider (PT): Dr. Sloan Leiter   Encounter Date: 05/19/2021   PT End of Session - 05/19/21 1618     Visit Number 8    Number of Visits 10    Date for PT Re-Evaluation 05/26/21    Authorization Type [employer]- 20 PT visits per year: no visits used this year    Authorization - Visit Number 8    Authorization - Number of Visits 20    Progress Note Due on Visit 10    PT Start Time 1503    PT Stop Time 1600    PT Time Calculation (min) 57 min    Activity Tolerance Patient tolerated treatment well;No increased pain    Behavior During Therapy WFL for tasks assessed/performed             Past Medical History:  Diagnosis Date   Endometriosis    Kidney stone    Medical history non-contributory    Ovarian cyst     Past Surgical History:  Procedure Laterality Date   LAPAROSCOPY     TONSILLECTOMY      There were no vitals filed for this visit.   Subjective Assessment - 05/19/21 1505     Subjective Pt reported she coughed on the way here but she was able to prevent leaking. Pain in R foot improved to 2/10 and L foot is 4/10. Pelvic pain has been better but incr. yesterday after a weekend of intercourse, on average pain is 2-3/10 and reported spasm (central pelvic floor). Pt reported peeing has been better, instead of voiding 30-60 mintues she is able to fully empty bladder and voids every two hours. She hasn't gotten a squatty potty yet but is elevating knees and states it's easier to pass bowel movement.    Patient Stated Goals To get back to how I should be, I feel older than I should be. Improve core strength, decr. pain.    Currently in Pain? Yes    Pain Score --   2-3/10   Pain Location Pelvis    Pain  Descriptors / Indicators Aching    Pain Type Chronic pain    Pain Onset More than a month ago    Pain Frequency Intermittent    Aggravating Factors  intercourse    Pain Relieving Factors motrin    Multiple Pain Sites Yes    Pain Score 4    Pain Location Foot    Pain Orientation Left    Pain Descriptors / Indicators Aching    Pain Type Chronic pain    Pain Onset 1 to 4 weeks ago    Pain Frequency Constant    Aggravating Factors  prolonged rest or standing    Pain Relieving Factors switching positions                               [facility] Adult PT Treatment/Exercise - 05/19/21 1611       Ambulation/Gait   Ambulation/Gait Yes    Ambulation/Gait Assistance 7: Independent    Ambulation Distance (Feet) 100 Feet    Assistive device None    Gait Pattern Step-through pattern    Ambulation Surface Indoor;Level    Gait Comments PT provided cues to  decr. B foot shuffle during heel strike to decr. force on B heels.      Manual Therapy   Manual Therapy Joint mobilization;Soft tissue mobilization;Muscle Energy Technique    Manual therapy comments Pt reported 0/10 R foot pain and 3/10 L foot pain after manual therapy and 2/10 pelvic pain at end of session.    Joint Mobilization Proximal tib/fib joint and talocural joint, PA joint mobs 3x30sec./joint, distraction to B feet 2x30sec. holds. Followed by dorsiflexion stretch to B feet 3x30 second holds. All to improve dorsiflexion and decr. pain. PAs to lower thoracic and upper lumbar spine area to improve mobility.    Soft tissue mobilization PT performed massage to B tx/lx spine paraspinals, R lat. dorsi, and R piriformis to decr. tension.    Muscle Energy Technique With pt in hooklying, PT perform contract/relax to B hip flexors (one a time) with R and L LEs off EOB followed by B hip flexor stretch. PT also performed contract/relax to B hip musculature with double knee to chest position (flex and ext) holds x 2 reps/activity.  Followed by ant/pelvic tilts in supine, standing, seated and standing again x10 reps with manual facilitation in standing and demo/cues for technique in order to obtain improved ant. pelvic tilt.           NMR: pelvic tilts and stretches.         PT Education - 05/19/21 1618     Education Details PT reviewed proper toileting posture. PT educated pt on using heat (with towelsl/sheets to protect skin integrity) after intercourse on hips/pelvis to decr. pain.    Person(s) Educated Patient    Methods Explanation    Comprehension Verbalized understanding              PT Short Term Goals - 04/21/21 1540       PT SHORT TERM GOAL #1   Title Pt will be IND in HEP to improve strength, flexibility and pain. TARGET DATE FOR ALL STGS: 04/01/21    Baseline No HEP. Partially met on 04/21/21 as she's performing 3-4x/week vs. daily.    Time 5    Period Weeks    Status Partially Met      PT SHORT TERM GOAL #2   Title Pt will report pelvic and LE pain </=4/10 at worst in order to improve QOL and safely perform ADLs.    Baseline 6/10 at worst    Time 5    Period Weeks    Status Not Met      PT SHORT TERM GOAL #3   Title Pt will report she is able to stand at work for >7 hours without incr. in pain to work safely.    Baseline 6 hours (some days she can stand the entire 8 hours, some times approx. 4 hours: pt can do approx. 2 days per week at 8 hours)    Time 5    Period Weeks    Status Partially Met               PT Long Term Goals - 05/05/21 1617       PT LONG TERM GOAL #1   Title Pt will report </=2/10 pelvic and joint pain at worst while performing ADLs in order to improve QOL. TARGET DATE FOR ALL LTGS: 05/06/21 (originally, now 06/06/21 2/2 missed appts)    Baseline 6/10    Time 10    Period Weeks    Status New  PT LONG TERM GOAL #2   Title Pt will report going to the gym 3x/week  in order to maintain gains made during PT.    Baseline 0 times per week    Time 10     Period Weeks    Status New      PT LONG TERM GOAL #3   Title Pt will report zero change of clothes during the week 2/2 urinary leaking.    Baseline 2-3x/week    Time 10    Period Weeks      PT LONG TERM GOAL #4   Title Pt will report 0/10 pain with intercourse and insertion of speculum during GYN exam.    Baseline 6/10    Time 10    Period Weeks    Status New      PT LONG TERM GOAL #5   Title Pt will improve urinary problem FOTO score to >/= 64 to improve QOL.    Baseline 53    Time 10    Period Weeks    Status New                   Plan - 05/19/21 1618     Clinical Impression Statement Pt demonstrated progress as she reported average B foot pain and pelvic pain has decr. and pt's incontinence has improved, along with being able to fully empty bladder resulting in voiding every two hours vs. every 30-60 minutes. Pt continues to experience decr. dorsiflexion in B ankles, pelvic pain and intermittent incontinence. Pt also had difficulty isolating pelvis to perform ant. pelvic tilts and required max cues and demo, along with manual therapy to improve mobility. Pt would continue to benefit from skilled PT to improve strength, flexility, pain, and posture during all ADLs.    Personal Factors and Comorbidities Comorbidity 3+    Comorbidities endometriosis, asthma, ovarian cyst, seasonal allergies.    Examination-Activity Limitations Continence;Locomotion Level;Stand;Toileting;Carry;Squat    Examination-Participation Restrictions Interpersonal Relationship;Driving;Cleaning;Occupation    PT Frequency 1x / week    PT Duration Other (comment)   10 weeks   PT Treatment/Interventions ADLs/Self Care Home Management;Biofeedback;Electrical Stimulation;Gait training;Functional mobility training;Therapeutic activities;Therapeutic exercise;Balance training;Neuromuscular re-education;Manual techniques;Spinal Manipulations;Joint Manipulations;Dry needling;Passive range of  motion;Patient/family education    PT Next Visit Plan Continue B foot manual therapy and exercises, gait training. Continue internal pelvic floor muscular manual therapy prn. assess for LLD and treat as indicated. Manual therapy to psoas, TFL, adductors, hip ERs, diaphragm, QL and LD as needed. Progress HEP prn (add pelvic tilts prn).    PT Home Exercise Plan Medbridge: [email or web address]    Consulted and Agree with Plan of Care Patient             Patient will benefit from skilled therapeutic intervention in order to improve the following deficits and impairments:  Abnormal gait, Decreased range of motion, Decreased strength, Decreased mobility, Postural dysfunction, Impaired flexibility, Improper body mechanics, Pain  Visit Diagnosis: Pelvic pain  Other lack of coordination  Stress incontinence of urine  Muscle weakness (generalized)  Pain in right hip     Problem List There are no problems to display for this patient.   Romona Murdy,Russie L 05/19/2021, 4:24 PM  [place] MAIN [facility] SERVICES [address] [place], [place], [address] Phone: [phone]   Fax:  [phone]  Name: Crystal Riley MRN: [id number] Date of Birth: [birth year]   Geoffry Paradise, PT,DPT 05/19/21 4:24 PM Phone: [phone] Fax: [phone]

## 2021-05-26 ENCOUNTER — Other Ambulatory Visit: Payer: Self-pay

## 2021-05-26 ENCOUNTER — Ambulatory Visit: Payer: 59 | Attending: Obstetrics & Gynecology

## 2021-05-26 DIAGNOSIS — R278 Other lack of coordination: Secondary | ICD-10-CM | POA: Insufficient documentation

## 2021-05-26 DIAGNOSIS — M6281 Muscle weakness (generalized): Secondary | ICD-10-CM | POA: Insufficient documentation

## 2021-05-26 DIAGNOSIS — N393 Stress incontinence (female) (male): Secondary | ICD-10-CM | POA: Insufficient documentation

## 2021-05-26 DIAGNOSIS — M25552 Pain in left hip: Secondary | ICD-10-CM | POA: Insufficient documentation

## 2021-05-26 DIAGNOSIS — M25551 Pain in right hip: Secondary | ICD-10-CM | POA: Diagnosis present

## 2021-05-26 DIAGNOSIS — R102 Pelvic and perineal pain: Secondary | ICD-10-CM | POA: Insufficient documentation

## 2021-05-26 NOTE — Therapy (Signed)
[place] MAIN [facility] SERVICES [address] [place], [place], [address] Phone: [phone]   Fax:  [phone]  Physical Therapy Treatment  Patient Details  Name: Crystal Riley MRN: [id number] Date of Birth: 03-23-1981 Referring Provider (PT): Dr. Sloan Leiter   Encounter Date: 05/26/2021   PT End of Session - 05/26/21 1613     Visit Number 9    Number of Visits [place]- 20 PT visits per year: no visits used this year    Authorization - Visit Number 9    Authorization - Number of Visits 20    Progress Note Due on Visit 10    PT Start Time 1507    PT Stop Time 1603    PT Time Calculation (min) 56 min    Activity Tolerance Patient tolerated treatment well;No increased pain    Behavior During Therapy WFL for tasks assessed/performed             Past Medical History:  Diagnosis Date   Endometriosis    Kidney stone    Medical history non-contributory    Ovarian cyst     Past Surgical History:  Procedure Laterality Date   LAPAROSCOPY     TONSILLECTOMY      There were no vitals filed for this visit.   Subjective Assessment - 05/26/21 1510     Subjective Pt reported she had a busy weekend. Pt had leakage while sneezing on Saturday night. Pt reported pain has better with intercourse. Pt was sore on Sat/Sun but had been working in the cooler on Thurs/Fri at work and [employer]. Pt reported she still has foot pain.    Pertinent History Endometriosis, asthma, seasonal allergies, ovarian cyst    Patient Stated Goals To get back to how I should be, I feel older than I should be. Improve core strength, decr. pain.    Currently in Pain? No/denies                               [facility] Adult PT Treatment/Exercise - 05/26/21 1606       Neuro Re-ed    Neuro Re-ed Details  Hooklying: pt performed x10 reps of post/ant pelvic tilts and x 10 reps while in quadruped. Quadruped: pt performed  cat/cow stretch x10 reps in order to maintain gains made during session and decr. pain.      Manual Therapy   Manual Therapy Joint mobilization;Soft tissue mobilization;Muscle Energy Technique    Manual therapy comments Pt reported 0/10 R foot pain and 3/10 L foot pain after manual therapy and 0/10 pelvic pain at end of session.    Joint Mobilization B talocural joint, PA joint mobs grade 3-4 3x30sec./joint. Followed by dorsiflexion stretch to B feet 3x30 second holds. All to improve dorsiflexion and decr. pain. Pt in L sidelying, PT performed PAs grade 3 to R SI joint with PT moving RLE into hip flex/ext 3x30 sec.    Internal Pelvic Floor PT assessed pelvic floor musculature for trigger points and strength. No pain or trigger points noted. Pt able to perform PF contraction but unable to sustain contraction for >2 seconds.                 SELF CARE:  PT Education - 05/26/21 1609     Education Details PT reiterated the importance of performing endurance pelvic floor contraction to improve strength and decr. incontinence.  PT educated pt on exam findings and potentially placing pt on hold after 8/22 vs. d/c which will be determined on progress. PT suggested pt try different shoes to wear to work and incr. frequency of stretches at work to reduce B foot pain.    Person(s) Educated Patient    Methods Explanation    Comprehension Verbalized understanding              PT Short Term Goals - 04/21/21 1540       PT SHORT TERM GOAL #1   Title Pt will be IND in HEP to improve strength, flexibility and pain. TARGET DATE FOR ALL STGS: 04/01/21    Baseline No HEP. Partially met on 04/21/21 as she's performing 3-4x/week vs. daily.    Time 5    Period Weeks    Status Partially Met      PT SHORT TERM GOAL #2   Title Pt will report pelvic and LE pain </=4/10 at worst in order to improve QOL and safely perform ADLs.    Baseline 6/10 at worst    Time 5    Period Weeks    Status Not Met       PT SHORT TERM GOAL #3   Title Pt will report she is able to stand at work for >7 hours without incr. in pain to work safely.    Baseline 6 hours (some days she can stand the entire 8 hours, some times approx. 4 hours: pt can do approx. 2 days per week at 8 hours)    Time 5    Period Weeks    Status Partially Met               PT Long Term Goals - 05/05/21 1617       PT LONG TERM GOAL #1   Title Pt will report </=2/10 pelvic and joint pain at worst while performing ADLs in order to improve QOL. TARGET DATE FOR ALL LTGS: 05/06/21 (originally, now 06/06/21 2/2 missed appts)    Baseline 6/10    Time 10    Period Weeks    Status New      PT LONG TERM GOAL #2   Title Pt will report going to the gym 3x/week  in order to maintain gains made during PT.    Baseline 0 times per week    Time 10    Period Weeks    Status New      PT LONG TERM GOAL #3   Title Pt will report zero change of clothes during the week 2/2 urinary leaking.    Baseline 2-3x/week    Time 10    Period Weeks      PT LONG TERM GOAL #4   Title Pt will report 0/10 pain with intercourse and insertion of speculum during GYN exam.    Baseline 6/10    Time 10    Period Weeks    Status New      PT LONG TERM GOAL #5   Title Pt will improve urinary problem FOTO score to >/= 64 to improve QOL.    Baseline 53    Time 10    Period Weeks    Status New                   Plan - 05/26/21 1613     Clinical Impression Statement Pt demonstrated progress as she reported no pain at end of session. Pt also reported no pain during  internal pelvic floor muscular exam, however, pt continues to experience difficulty sustaining pelvic floor contraction for > 2 seconds. Pt also required moderate cues for proper technique during quadruped activities. Pt continues to experience intermittent B foot pain and pain with intercourse and leakage. Therfore, pt would continue to benefit from skilled PT to improve deficits listed  above.    Personal Factors and Comorbidities Comorbidity 3+    Comorbidities endometriosis, asthma, ovarian cyst, seasonal allergies.    Examination-Activity Limitations Continence;Locomotion Level;Stand;Toileting;Carry;Squat    Examination-Participation Restrictions Interpersonal Relationship;Driving;Cleaning;Occupation    PT Frequency 1x / week    PT Duration Other (comment)   10 weeks   PT Treatment/Interventions ADLs/Self Care Home Management;Biofeedback;Electrical Stimulation;Gait training;Functional mobility training;Therapeutic activities;Therapeutic exercise;Balance training;Neuromuscular re-education;Manual techniques;Spinal Manipulations;Joint Manipulations;Dry needling;Passive range of motion;Patient/family education    PT Next Visit Plan Continue B foot manual therapy and exercises, gait training. Continue internal pelvic floor muscular manual therapy prn. assess for LLD and treat as indicated. Manual therapy to psoas, TFL, adductors, hip ERs, diaphragm, QL and LD as needed. Progress HEP prn (add pelvic tilts prn).    PT Home Exercise Plan Medbridge: [email or web address]    Consulted and Agree with Plan of Care Patient             Patient will benefit from skilled therapeutic intervention in order to improve the following deficits and impairments:  Abnormal gait, Decreased range of motion, Decreased strength, Decreased mobility, Postural dysfunction, Impaired flexibility, Improper body mechanics, Pain  Visit Diagnosis: Stress incontinence of urine  Other lack of coordination  Pelvic pain  Muscle weakness (generalized)  Pain in right hip     Problem List There are no problems to display for this patient.   Turkessa Ostrom,Ronan L 05/26/2021, 4:17 PM  [place] MAIN [facility] SERVICES [address] [place], [place], [address] Phone: [phone]   Fax:  [phone]  Name: Crystal Riley MRN: [id number] Date of Birth: 1980/11/16   Geoffry Paradise, PT,DPT 05/26/21 4:17 PM Phone: [phone] Fax: [phone]

## 2021-06-02 ENCOUNTER — Ambulatory Visit: Payer: 59

## 2021-06-09 ENCOUNTER — Ambulatory Visit: Payer: 59

## 2021-06-09 ENCOUNTER — Other Ambulatory Visit: Payer: Self-pay

## 2021-06-09 DIAGNOSIS — N393 Stress incontinence (female) (male): Secondary | ICD-10-CM | POA: Diagnosis not present

## 2021-06-09 DIAGNOSIS — M25552 Pain in left hip: Secondary | ICD-10-CM

## 2021-06-09 DIAGNOSIS — R278 Other lack of coordination: Secondary | ICD-10-CM

## 2021-06-09 DIAGNOSIS — R102 Pelvic and perineal pain: Secondary | ICD-10-CM

## 2021-06-09 DIAGNOSIS — M6281 Muscle weakness (generalized): Secondary | ICD-10-CM

## 2021-06-09 DIAGNOSIS — M25551 Pain in right hip: Secondary | ICD-10-CM

## 2021-06-09 NOTE — Therapy (Signed)
[place] MAIN [facility] SERVICES [address] [place], [place], [address] Phone: [phone]   Fax:  [phone]  Physical Therapy Treatment  Patient Details  Name: Crystal Riley MRN: [id number] Date of Birth: 1981-09-12 Referring Provider (PT): Dr. Sloan Leiter   Encounter Date: 06/09/2021   PT End of Session - 06/09/21 1612     Visit Number 10    Number of Visits 75   original POC 10 visits, asking for 7 additional visits for 17 total   Date for PT Re-Evaluation 05/26/21    Authorization Type [employer]- 20 PT visits per year: no visits used this year    Authorization - Visit Number 10    Authorization - Number of Visits 20    Progress Note Due on Visit 10    PT Start Time 1501    PT Stop Time 0174   as IT present for 4 minutes 2/2 computer issues   PT Time Calculation (min) 64 min    Activity Tolerance Patient tolerated treatment well;No increased pain    Behavior During Therapy WFL for tasks assessed/performed             Past Medical History:  Diagnosis Date   Endometriosis    Kidney stone    Medical history non-contributory    Ovarian cyst     Past Surgical History:  Procedure Laterality Date   LAPAROSCOPY     TONSILLECTOMY      There were no vitals filed for this visit.   Subjective Assessment - 06/09/21 1505     Subjective Pt reported her feet are in pain after sitting down for 45 minutes. Pt has been using tennis ball on feet to help reduce trigger points. Pt reported she didn't do HEP last week as she was busy buying new car (Wed/Thurs/Fri). Because of this she had incr. in B hip pain and LLE sciatic pain. Pt has not tried new shoes. Pt feels that intercourse was more painful insertion (difficulty finding a comfortable position) the last two times she had intercourse. Pt able to stand approx. 5 hours at work without pain kicking up. Pt states she is also passing a kidney stone.    Pertinent History Endometriosis, asthma,  seasonal allergies, ovarian cyst    Patient Stated Goals To get back to how I should be, I feel older than I should be. Improve core strength, decr. pain.    Currently in Pain? Yes    Pain Score 6     Pain Location Foot    Pain Orientation Left;Right    Pain Descriptors / Indicators Aching    Pain Type Chronic pain    Pain Radiating Towards LLE    Pain Onset More than a month ago    Pain Frequency Intermittent    Aggravating Factors  seated for prolonged periods of time, first thing in the morning    Pain Relieving Factors walking, stretching                               [facility] Adult PT Treatment/Exercise - 06/09/21 1620       Neuro Re-ed    Neuro Re-ed Details  PT had pt amb. with decr. scuffing of L heel and improved heel strike, then instructed pt to amb. on ball of foot to heel to decr. pain. PT had pt amb. in this manner with and without kinesiotaping placed on feet to improve propioception  as pt noted to experience decr. B foot supination during stance. 4x100'. Pt then performed standing and seated arch curls B foot and seated toe abd. B. x10 reps/position/LE. Added to HEP. PT reported pain decr. to 0-2/10 in R foot and 4-5/10 in L foot at end of session. Cues and demo for proper technique. Performed with S as pt noted to experience incr. postural sway during amb. with ball of foot strike vs. heel strike.                  SELF CARE:  PT Education - 06/09/21 1609     Education Details PT educated pt on goal progress and renewal process for 6 additional visits, with all unmet goals being carried over to new POC. PT educated pt on kinesiotaping to improve B foot pronation in order to provide proprioception to feet. PT discussed the importance of performing HEP, even on busy days, especially endurance pelvic floor contraction in order to reduce leakage as pelvic floor musculature is 80% endurance muscle fibers. PT provided pt with foot HEP in order to decr.  foot and pelvic floor pain 2/2 kinetic chain from foot to hips.    Person(s) Educated Patient    Methods Explanation;Demonstration;Tactile cues;Verbal cues;Handout    Comprehension Returned demonstration;Verbalized understanding;Need further instruction            PREVIOUS GOALS: PT SHORT TERM GOAL #1   Title Pt will be IND in HEP to improve strength, flexibility and pain.  TARGET DATE FOR ALL STGS: 04/01/21    Baseline No HEP. Partially met on 04/21/21 as she's performing 3-4x/week  vs. daily.    Time 5    Period Weeks    Status Partially Met      PT SHORT TERM GOAL #2   Title Pt will report pelvic and LE pain </=4/10 at worst in order to  improve QOL and safely perform ADLs.    Baseline 6/10 at worst    Time 5    Period Weeks    Status Not Met      PT SHORT TERM GOAL #3   Title Pt will report she is able to stand at work for >7 hours without  incr. in pain to work safely.    Baseline 6 hours (some days she can stand the entire 8 hours, some times  approx. 4 hours: pt can do approx. 2 days per week at 8 hours)    Time 5    Period Weeks    Status Partially Met           PT Long Term Goals - 06/09/21 1522      PT LONG TERM GOAL #1   Title Pt will report </=2/10 pelvic and joint pain at worst while  performing ADLs in order to improve QOL. TARGET DATE FOR ALL LTGS: 05/06/21  (originally, now 06/06/21 2/2 missed appts)    Baseline 6/10; pt said it did get up to 10/10 at baseline but is now 8/10  (pelvic floor).    Time 10    Period Weeks    Status Not Met      PT LONG TERM GOAL #2   Title Pt will report going to the gym 3x/week  in order to maintain gains  made during PT.    Baseline 0 times per week; pt has not gone to the gym yet    Time 10    Period Weeks    Status Not Met  PT LONG TERM GOAL #3   Title Pt will report zero change of clothes during the week 2/2 urinary  leaking.    Baseline 2-3x/week baseline; 1-2 times a week    Time 10    Period Weeks     Status Partially Met      PT LONG TERM GOAL #4   Title Pt will report 0/10 pain with intercourse and insertion of speculum  during GYN exam.    Baseline 6/10 baseline; 2-3/10 with insertion on 06/09/21    Time 10    Period Weeks    Status Partially Met      PT LONG TERM GOAL #5   Title Pt will improve urinary problem FOTO score to >/= 64 to improve  QOL.    Baseline 53    Time 10    Period Weeks    Status On-going         NEW POC GOALS:   PT Short Term Goals - 06/09/21 1625       PT SHORT TERM GOAL #1   Title Pt will be IND in HEP to improve strength, flexibility and pain. TARGET DATE FOR ALL unmet STGS: 08/04/21    Baseline No HEP. Partially met on 04/21/21 as she's performing 3-4x/week vs. daily.    Time 5    Period Weeks    Status On-going      PT SHORT TERM GOAL #2   Title Pt will report pelvic and LE pain </=4/10 at worst in order to improve QOL and safely perform ADLs.    Baseline 6/10 at worst    Time 5    Period Weeks    Status On-going      PT SHORT TERM GOAL #3   Title Pt will report she is able to stand at work for >7 hours without incr. in pain to work safely.    Baseline 6 hours (some days she can stand the entire 8 hours, some times approx. 4 hours: pt can do approx. 2 days per week at 8 hours)    Time 5    Period Weeks    Status On-going               PT Long Term Goals - 06/09/21 1522       PT LONG TERM GOAL #1   Title Pt will report </=2/10 pelvic and joint pain at worst while performing ADLs in order to improve QOL. TARGET DATE FOR ALL unmet LTGS: 09/15/21    Baseline 6/10; pt said it did get up to 10/10 at baseline but is now 8/10 (pelvic floor).    Time 10    Period Weeks    Status On-going      PT LONG TERM GOAL #2   Title Pt will report going to the gym 3x/week  in order to maintain gains made during PT.    Baseline 0 times per week; pt has not gone to the gym yet    Time 10    Period Weeks    Status On-going      PT LONG  TERM GOAL #3   Title Pt will report zero change of clothes during the week 2/2 urinary leaking.    Baseline 2-3x/week baseline; 1-2 times a week    Time 10    Period Weeks    Status On-going      PT LONG TERM GOAL #4   Title Pt will report 0/10 pain with intercourse and insertion of  speculum during GYN exam.    Baseline 6/10 baseline; 2-3/10 with insertion on 06/09/21    Time 10    Period Weeks    Status On-going      PT LONG TERM GOAL #5   Title Pt will improve urinary problem FOTO score to >/= 64 to improve QOL.    Baseline 53    Time 10    Period Weeks    Status On-going                   Plan - 06/09/21 1613     Clinical Impression Statement Pt partially met LTGs 3 and 4, pt did meet LTGs 1 or 2, LTG 5 (FOTO) deferred to end of next POC. Pt demonstrated in session as B foot pain decr. with taping for NMR during gait. Pt unable to perform L toe abduction of 5th digit, which coud be contriburing to foot pain. Pt continues to experience pelvic pain, decr. strength, incontinence, impaired flexibility, dyspurenia, back/LE pain and impaired balance, and would continue to benefit from skilled PT to improve deficits listed above. All unmet goals will be carried over to new POC. PT asking for 7 additional visits, 4 visits every week, then 3 visits every other week. POC will be extended by additional four weeks as primary PT will be out 06/17/21-07/21/21.    Personal Factors and Comorbidities Comorbidity 3+    Comorbidities endometriosis, asthma, ovarian cyst, seasonal allergies.    Examination-Activity Limitations Continence;Locomotion Level;Stand;Toileting;Carry;Squat    Examination-Participation Restrictions Interpersonal Relationship;Driving;Cleaning;Occupation    PT Frequency 1x / week    PT Duration Other (comment)   1x/week for 4 weeks and then 3 visits every other week with additional 4 week extension 2/2 primary PT out.   PT Treatment/Interventions ADLs/Self Care Home  Management;Biofeedback;Electrical Stimulation;Gait training;Functional mobility training;Therapeutic activities;Therapeutic exercise;Balance training;Neuromuscular re-education;Manual techniques;Spinal Manipulations;Joint Manipulations;Dry needling;Passive range of motion;Patient/family education;Taping    PT Next Visit Plan Continue internal pelvic floor muscular manual therapy prn. assess for LLD and treat as indicated. Manual therapy to psoas, TFL, adductors, hip ERs, diaphragm, QL and LD as needed. Progress HEP prn (add pelvic tilts prn). Continue B foot manual therapy and exercises, gait training.    PT Home Exercise Plan Medbridge: [email or web address]    Consulted and Agree with Plan of Care Patient             Patient will benefit from skilled therapeutic intervention in order to improve the following deficits and impairments:  Abnormal gait, Decreased range of motion, Decreased strength, Decreased mobility, Postural dysfunction, Impaired flexibility, Improper body mechanics, Pain  Visit Diagnosis: Other lack of coordination - Plan: PT plan of care cert/re-cert  Muscle weakness (generalized) - Plan: PT plan of care cert/re-cert  Stress incontinence of urine - Plan: PT plan of care cert/re-cert  Pelvic pain - Plan: PT plan of care cert/re-cert  Pain in left hip - Plan: PT plan of care cert/re-cert  Pain in right hip - Plan: PT plan of care cert/re-cert     Problem List There are no problems to display for this patient.   Annise Boran,Raine L 06/09/2021, 4:29 PM  [place] MAIN [facility] SERVICES [address] [place], [place], [address] Phone: [phone]   Fax:  [phone]  Name: Crystal Riley MRN: [id number] Date of Birth: 1981/04/11   Progress Note Reporting Period 02/25/21 to 06/09/21  See note below for Objective Data and Assessment of Progress/Goals.   Geoffry Paradise, PT,DPT 06/09/21 4:30  PM Phone: [phone] Fax:  [phone]

## 2021-06-16 ENCOUNTER — Ambulatory Visit: Payer: 59

## 2021-06-16 ENCOUNTER — Other Ambulatory Visit: Payer: Self-pay

## 2021-06-16 DIAGNOSIS — M25552 Pain in left hip: Secondary | ICD-10-CM

## 2021-06-16 DIAGNOSIS — M6281 Muscle weakness (generalized): Secondary | ICD-10-CM

## 2021-06-16 DIAGNOSIS — N393 Stress incontinence (female) (male): Secondary | ICD-10-CM

## 2021-06-16 DIAGNOSIS — M25551 Pain in right hip: Secondary | ICD-10-CM

## 2021-06-16 DIAGNOSIS — R278 Other lack of coordination: Secondary | ICD-10-CM

## 2021-06-16 DIAGNOSIS — R102 Pelvic and perineal pain: Secondary | ICD-10-CM

## 2021-06-16 NOTE — Therapy (Signed)
[place] MAIN [facility] SERVICES [address] [place], [place], [address] Phone: [phone]   Fax:  [phone]  Physical Therapy Treatment  Patient Details  Name: Crystal Riley MRN: [id number] Date of Birth: 21-Apr-1981 Referring Provider (PT): Dr. Sloan Leiter   Encounter Date: 06/16/2021   PT End of Session - 06/16/21 1220     Visit Number 11    Number of Visits 17    Date for PT Re-Evaluation 08/15/21    Authorization Type [employer]- 20 PT visits per year: no visits used this year    Authorization - Visit Number 11    Authorization - Number of Visits 20    Progress Note Due on Visit 10    PT Start Time 1104    PT Stop Time 1205    PT Time Calculation (min) 61 min    Activity Tolerance Patient tolerated treatment well;No increased pain    Behavior During Therapy WFL for tasks assessed/performed             Past Medical History:  Diagnosis Date   Endometriosis    Kidney stone    Medical history non-contributory    Ovarian cyst     Past Surgical History:  Procedure Laterality Date   LAPAROSCOPY     TONSILLECTOMY      There were no vitals filed for this visit.   Subjective Assessment - 06/16/21 1107     Subjective Pt reported the KT helped pt's B foot pain by half (at least). She has been using tennis balls on feet to reduce trigger points. Pt reported no leakage this last week. Pt still reports the hold is endurance pelvic floor contraction is still challenging. Pt reported she is able to perform diaphragmatic breathing if she feels muscles start to tense during intercourse.    Pertinent History Endometriosis, asthma, seasonal allergies, ovarian cyst    Patient Stated Goals To get back to how I should be, I feel older than I should be. Improve core strength, decr. pain.    Currently in Pain? No/denies                               [facility] Adult PT Treatment/Exercise - 06/16/21 1216       Neuro Re-ed     Neuro Re-ed Details  PT applied kinesiotape to pt's B foot again this session and had pt amb. with decr. scuffing of L heel and improved heel strike, then instructed pt to amb. on ball of foot to heel to decr. pain 2x100'. PT had pt amb. in this manner with and without kinesiotaping placed on feet to improve propioception as pt noted to experience decr. B foot supination during stance. Pt reported improve "lightness" in feet with tape in place. Pt then performed hooklying pelvic tilts, ant/post x10 reps. Quadruped: pt performed x10 reps of pelvic tilts, alternating B UE shoulder flexion x10/UE, BLE hip ext with toes in contract with mat x10/LE, alternating shoulder flex/contralat. LE hip ext 2x5 reps all with TrA engagement performed to improve pelvic and hip pain and core stability. Pt required manual cues, verbal cues and demo for proper technique. Performed with S for safety.            Pt did require rest breaks during quadruped activities to ensure proper technique 2/2 fatigue.        PT Education - 06/16/21 1113     Education Details  PT discussed the importance of continuing HEP during 4 week break.    Person(s) Educated Patient    Methods Explanation    Comprehension Verbalized understanding              PT Short Term Goals - 06/09/21 1625       PT SHORT TERM GOAL #1   Title Pt will be IND in HEP to improve strength, flexibility and pain. TARGET DATE FOR ALL unmet STGS: 08/04/21    Baseline No HEP. Partially met on 04/21/21 as she's performing 3-4x/week vs. daily.    Time 5    Period Weeks    Status On-going      PT SHORT TERM GOAL #2   Title Pt will report pelvic and LE pain </=4/10 at worst in order to improve QOL and safely perform ADLs.    Baseline 6/10 at worst    Time 5    Period Weeks    Status On-going      PT SHORT TERM GOAL #3   Title Pt will report she is able to stand at work for >7 hours without incr. in pain to work safely.    Baseline 6 hours (some  days she can stand the entire 8 hours, some times approx. 4 hours: pt can do approx. 2 days per week at 8 hours)    Time 5    Period Weeks    Status On-going               PT Long Term Goals - 06/09/21 1522       PT LONG TERM GOAL #1   Title Pt will report </=2/10 pelvic and joint pain at worst while performing ADLs in order to improve QOL. TARGET DATE FOR ALL unmet LTGS: 09/15/21    Baseline 6/10; pt said it did get up to 10/10 at baseline but is now 8/10 (pelvic floor).    Time 10    Period Weeks    Status On-going      PT LONG TERM GOAL #2   Title Pt will report going to the gym 3x/week  in order to maintain gains made during PT.    Baseline 0 times per week; pt has not gone to the gym yet    Time 10    Period Weeks    Status On-going      PT LONG TERM GOAL #3   Title Pt will report zero change of clothes during the week 2/2 urinary leaking.    Baseline 2-3x/week baseline; 1-2 times a week    Time 10    Period Weeks    Status On-going      PT LONG TERM GOAL #4   Title Pt will report 0/10 pain with intercourse and insertion of speculum during GYN exam.    Baseline 6/10 baseline; 2-3/10 with insertion on 06/09/21    Time 10    Period Weeks    Status On-going      PT LONG TERM GOAL #5   Title Pt will improve urinary problem FOTO score to >/= 64 to improve QOL.    Baseline 53    Time 10    Period Weeks    Status On-going                   Plan - 06/16/21 1222     Clinical Impression Statement Pt demonstrated progress as she reported significant pain reduction in B foot after taping last session and using  tennis ball to continue gains made during manual therapy last session, as pelvic pain and leakage is closely tied to feet based on LE kinectic chain. Pt also denied leakge since last visit. Pt was able to progress core and pelvic activities into quadruped. Pt did require cues for improve TrA engagement and decr. compensatory strategies 2/2 weakness in  quadruped. Pt would continue to benefit from skilled PT to improve leakage, pain, and strength during all ADLs.    Personal Factors and Comorbidities Comorbidity 3+    Comorbidities endometriosis, asthma, ovarian cyst, seasonal allergies.    Examination-Activity Limitations Continence;Locomotion Level;Stand;Toileting;Carry;Squat    Examination-Participation Restrictions Interpersonal Relationship;Driving;Cleaning;Occupation    PT Frequency 1x / week    PT Duration Other (comment)   1x/week for 4 weeks and then 3 visits every other week with additional 4 week extension 2/2 primary PT out.   PT Treatment/Interventions ADLs/Self Care Home Management;Biofeedback;Electrical Stimulation;Gait training;Functional mobility training;Therapeutic activities;Therapeutic exercise;Balance training;Neuromuscular re-education;Manual techniques;Spinal Manipulations;Joint Manipulations;Dry needling;Passive range of motion;Patient/family education;Taping    PT Next Visit Plan Reassess. Continue internal pelvic floor muscular manual therapy prn. assess for LLD and treat as indicated. Manual therapy to psoas, TFL, adductors, hip ERs, diaphragm, QL and LD as needed. Progress HEP prn (add pelvic tilts prn). Continue B foot manual therapy and exercises, gait training. Quadruped activities.    PT Home Exercise Plan Medbridge: [email or web address]    Consulted and Agree with Plan of Care Patient             Patient will benefit from skilled therapeutic intervention in order to improve the following deficits and impairments:  Abnormal gait, Decreased range of motion, Decreased strength, Decreased mobility, Postural dysfunction, Impaired flexibility, Improper body mechanics, Pain  Visit Diagnosis: Other lack of coordination  Muscle weakness (generalized)  Stress incontinence of urine  Pelvic pain  Pain in left hip  Pain in right hip     Problem List There are no problems to display for this patient.   Al Bracewell,Ajane  L 06/16/2021, 12:27 PM  [place] MAIN [facility] SERVICES [address] [place], [place], [address] Phone: [phone]   Fax:  [phone]  Name: KATHALEEN DUDZIAK MRN: [id number] Date of Birth: [birth year]   Geoffry Paradise, PT,DPT 06/16/21 12:27 PM Phone: [phone] Fax: [phone]

## 2021-07-21 ENCOUNTER — Ambulatory Visit: Payer: 59

## 2021-07-28 ENCOUNTER — Ambulatory Visit: Payer: 59 | Attending: Obstetrics & Gynecology

## 2021-07-28 ENCOUNTER — Other Ambulatory Visit: Payer: Self-pay

## 2021-07-28 DIAGNOSIS — R278 Other lack of coordination: Secondary | ICD-10-CM | POA: Diagnosis not present

## 2021-07-28 DIAGNOSIS — M25551 Pain in right hip: Secondary | ICD-10-CM | POA: Insufficient documentation

## 2021-07-28 DIAGNOSIS — M25552 Pain in left hip: Secondary | ICD-10-CM | POA: Diagnosis present

## 2021-07-28 DIAGNOSIS — R102 Pelvic and perineal pain: Secondary | ICD-10-CM | POA: Insufficient documentation

## 2021-07-28 DIAGNOSIS — M6281 Muscle weakness (generalized): Secondary | ICD-10-CM | POA: Diagnosis present

## 2021-07-28 DIAGNOSIS — N393 Stress incontinence (female) (male): Secondary | ICD-10-CM | POA: Diagnosis present

## 2021-07-28 NOTE — Therapy (Signed)
[place] MAIN [facility] SERVICES [address] [place], [place], [address] Phone: [phone]   Fax:  [phone]  Physical Therapy Treatment  Patient Details  Name: Crystal Riley MRN: [id number] Date of Birth: Sep 07, 1981 Referring Provider (PT): Dr. Sloan Leiter   Encounter Date: 07/28/2021   PT End of Session - 07/28/21 1610     Visit Number 12    Number of Visits 17    Date for PT Re-Evaluation 08/15/21    Authorization Type [employer]- 20 PT visits per year: no visits used this year    Authorization - Visit Number 12    Authorization - Number of Visits 20    Progress Note Due on Visit 10    PT Start Time 1505    PT Stop Time 1601    PT Time Calculation (min) 56 min    Activity Tolerance Patient tolerated treatment well;No increased pain    Behavior During Therapy WFL for tasks assessed/performed             Past Medical History:  Diagnosis Date   Endometriosis    Kidney stone    Medical history non-contributory    Ovarian cyst     Past Surgical History:  Procedure Laterality Date   LAPAROSCOPY     TONSILLECTOMY      There were no vitals filed for this visit.   Subjective Assessment - 07/28/21 1510     Subjective Pt reported she did not have pelvic pain during a five hour drive in one day but did have L elbow pain. Pt took a break from HEP during nine day vacation. Pt denied pain with intercourse. Pt reported leakage once a week, and has to change clothes over the last month. Pt was able to performed pelvic floor contraction and adductor contraction while in car to prevent leakage. Pt reported B foot pain has been better, she did tape her feet for a few weeks but stopped as she was going to tanning bed. Pt reported decr. pelvic pain and L hip pain while standing at work but it has improved since that time. Pt reported taking OTC pain meds q 6 hrs to only 0-2 times a day.    Pertinent History Endometriosis, asthma, seasonal  allergies, ovarian cyst    How long can you sit comfortably? max 30 minutes before needing to reposition-still the same 07/28/21    How long can you stand comfortably? less than 6 hours-still the same 07/28/21    Patient Stated Goals To get back to how I should be, I feel older than I should be. Improve core strength, decr. pain.    Currently in Pain? Yes    Pain Score 3     Pain Location Buttocks    Pain Orientation Left;Right   more L than R   Pain Descriptors / Indicators Tender;Aching    Pain Type Acute pain    Pain Onset Today    Pain Frequency Intermittent    Aggravating Factors  sitting down    Pain Relieving Factors repositioning                    Pelvic Floor Physical Therapy Re-Evaluation and Assessment   OBJECTIVE  Posture/Observations:  Sitting: rounded shoulders Standing: incr. Lx spine lordosis, R hip elevated.  Palpation/Segmental Motion/Joint Play: Tx spine hypomobility and slight incr. In Tx spine S curve to the R side  Incr. Tension in B adductors, B paraspinals and B IT bands. Pt  reported TTP over L piriformis prior to manual therapy but reported decr. After manual therapy.    Range of Motion/Flexibilty:  Spine: pt reported pain in low back during B sidebending       Pelvic Floor External Exam: Introitus Appears:  Skin integrity:  Palpation: Cough: Prolapse visible?: Scar mobility: Through clothing: Ischial tuberosities: no TTP Palpation for pelvic floor contraction: no Coccyx: no deviation             [facility] Adult PT Treatment/Exercise - 07/28/21 1627       Manual Therapy   Manual Therapy Soft tissue mobilization    Manual therapy comments Pt reported no pain at end of session after manual therapy.    Soft tissue mobilization PT performed STM to B hip adductors, B IT bands, and B paraspinals to reduce pain, trigger points and to improve hip alignment 2/2 shortened musculature. Pt in supine and prone.                      PT Education - 07/28/21 1609     Education Details PT discussed goal progress extended 2/2 pt missing last 6 weeks of PT 2/2 primary PT was out. PT encouraged pt to begin HEP again after see took a break while on vacation (and last week) and we will review next session.    Person(s) Educated Patient    Methods Explanation    Comprehension Verbalized understanding              PT Short Term Goals - 07/28/21 1525       PT SHORT TERM GOAL #1   Title Pt will be IND in HEP to improve strength, flexibility and pain. TARGET DATE FOR ALL unmet STGS: 08/25/21    Baseline No HEP. Partially met on 04/21/21 as she's performing 3-4x/week vs. daily. partially met 07/28/21: took nine days off while on vacation    Time 5    Period Weeks    Status Partially Met      PT SHORT TERM GOAL #2   Title Pt will demonstrate ability to lengthen pelvic floor muscultature in order to relax pelvic floorand demo improved coordination to decr. pain (</=4/10) during pelvic exam, tampon insertion and intercourse.    Baseline 6/10 at worst baseline. 4-5/10 at worst 07/28/21    Time 5    Period Weeks    Status Partially Met      PT SHORT TERM GOAL #3   Title Pt will report she is able to stand at work for >7 hours without incr. in pain to work safely.    Baseline 6 hours (some days she can stand the entire 8 hours, some times approx. 4 hours: pt can do approx. 2 days per week at 8 hours)    Time 5    Period Weeks    Status Not Met               PT Long Term Goals - 07/28/21 1612       PT LONG TERM GOAL #1   Title Pt will demonstrate equal hip alignment and decr. LLD to improve posture and reduce pelvic and joint pain (</=2/10)while at work and performing ADLs. TARGET DATE FOR ALL unmet LTGS: 09/22/21    Baseline 6/10; pt said it did get up to 10/10 at baseline but is now 8/10 (pelvic floor).    Time 10    Period Weeks    Status On-going  PT LONG TERM GOAL #2   Title Pt will  report going to the gym 3x/week  in order to maintain gains (strength) made during PT and improve QOL.    Baseline 0 times per week; pt has not gone to the gym yet    Time 10    Period Weeks    Status On-going      PT LONG TERM GOAL #3   Title Pt will report she did not have to change of clothes during the week 2/2 urinary leaking.    Baseline 2-3x/week baseline; 1-2 times a week    Time 10    Period Weeks    Status On-going      PT LONG TERM GOAL #4   Title Pt will demonstrate ability to lengthen pelvic floor muscultature in order to relax pelvic floorand demo improved coordination to decr. pain (</=2/10) during pelvic exam, tampon insertion and intercourse.    Baseline 6/10 baseline; 2-3/10 with insertion on 06/09/21    Time 10    Period Weeks    Status On-going      PT LONG TERM GOAL #5   Title Pt will improve urinary problem FOTO score to >/= 64 to improve QOL.    Baseline 53    Time 10    Period Weeks    Status On-going                   Plan - 07/28/21 1612     Clinical Impression Statement Pt presenting today after 6 weeks break from PT 2/2 primary PT was out. PT performed a re-eval and pt's STG were not met or partially met, most likely due to pt not performing HEP for the last few weeks while on vacation. Pt's R iliac crest elevated in stance and LLE 0.25" longer than RLE in supine. Pt's overall pain has decr. during ADLs and intercourse. Pt's B IT band and B hip add and B paraspinals musculature noted to have incr. tension and trigger points during palpation with TTP at R IT band, R paraspinals, and B hip add with decr. pain after manual therapy. Pt's ROM was WNL but pt reported incr. in LBP during B sidebending. Pt will submit new cert for updated POC, extending all current STG and LTGs in order to improve pt's PFM coordination, decr. pain, improve strength, balance, and flexibility in order to improve QOL and ability to perform ADLs and IADLs.    Personal Factors  and Comorbidities Comorbidity 3+    Comorbidities endometriosis, asthma, ovarian cyst, seasonal allergies.    Examination-Activity Limitations Continence;Locomotion Level;Stand;Toileting;Carry;Squat    Examination-Participation Restrictions Interpersonal Relationship;Driving;Cleaning;Occupation    Rehab Potential Good    PT Frequency 1x / week    PT Duration Other (comment)   1x/week for 4 weeks and then 3 visits every other week with additional 4 week extension 2/2 primary PT out.   PT Treatment/Interventions ADLs/Self Care Home Management;Biofeedback;Electrical Stimulation;Gait training;Functional mobility training;Therapeutic activities;Therapeutic exercise;Balance training;Neuromuscular re-education;Manual techniques;Spinal Manipulations;Joint Manipulations;Dry needling;Passive range of motion;Patient/family education;Taping    PT Next Visit Plan Review and modify HEP prn. Continue internal pelvic floor muscular manual therapy prn. assess for LLD and treat as indicated. Manual therapy to psoas, TFL, adductors, hip ERs, diaphragm, QL and LD as needed. Progress HEP prn (add pelvic tilts prn). Continue B foot manual therapy and exercises, gait training. Quadruped activities.    PT Home Exercise Plan Medbridge: [id number]    Consulted and Agree with Plan of Care Patient  Patient will benefit from skilled therapeutic intervention in order to improve the following deficits and impairments:  Abnormal gait, Decreased range of motion, Decreased strength, Decreased mobility, Postural dysfunction, Impaired flexibility, Improper body mechanics, Pain  Visit Diagnosis: Other lack of coordination - Plan: PT plan of care cert/re-cert  Stress incontinence of urine - Plan: PT plan of care cert/re-cert  Muscle weakness (generalized) - Plan: PT plan of care cert/re-cert  Pelvic pain - Plan: PT plan of care cert/re-cert  Pain in left hip - Plan: PT plan of care cert/re-cert  Pain in right hip  - Plan: PT plan of care cert/re-cert     Problem List There are no problems to display for this patient.   Devory Mckinzie,Lititia L, PT 07/28/2021, 4:28 PM  [place] MAIN [facility] SERVICES [address] [place], [place], [address] Phone: [phone]   Fax:  [phone]  Name: AEDYN MCKEON MRN: [id number] Date of Birth: 1980-11-09   Geoffry Paradise, PT,DPT 07/28/21 4:32 PM Phone: [phone] Fax: [phone]

## 2021-08-04 ENCOUNTER — Ambulatory Visit: Payer: 59

## 2021-08-18 ENCOUNTER — Ambulatory Visit: Payer: 59

## 2021-09-01 ENCOUNTER — Ambulatory Visit: Payer: 59 | Attending: Obstetrics & Gynecology

## 2021-09-01 ENCOUNTER — Other Ambulatory Visit: Payer: Self-pay

## 2021-09-01 DIAGNOSIS — R102 Pelvic and perineal pain: Secondary | ICD-10-CM | POA: Diagnosis present

## 2021-09-01 DIAGNOSIS — M6281 Muscle weakness (generalized): Secondary | ICD-10-CM | POA: Insufficient documentation

## 2021-09-01 DIAGNOSIS — R278 Other lack of coordination: Secondary | ICD-10-CM | POA: Diagnosis not present

## 2021-09-01 DIAGNOSIS — M25552 Pain in left hip: Secondary | ICD-10-CM | POA: Diagnosis present

## 2021-09-01 DIAGNOSIS — N393 Stress incontinence (female) (male): Secondary | ICD-10-CM | POA: Insufficient documentation

## 2021-09-01 DIAGNOSIS — M25551 Pain in right hip: Secondary | ICD-10-CM | POA: Diagnosis present

## 2021-09-01 NOTE — Therapy (Signed)
[place] MAIN [facility] SERVICES [address] [place], [place], [address] Phone: [phone]   Fax:  [phone]  Physical Therapy Treatment  Patient Details  Name: Crystal Riley MRN: [id number] Date of Birth: [birth year] Referring Provider (PT): Dr. Sloan Leiter   Encounter Date: 09/01/2021   PT End of Session - 09/01/21 1607     Visit Number 13    Number of Visits 17    Date for PT Re-Evaluation 09/15/21    Authorization Type [employer]- 20 PT visits per year: no visits used this year    Authorization - Visit Number 13    Authorization - Number of Visits 20    Progress Note Due on Visit 10    PT Start Time 1503    PT Stop Time 1559    PT Time Calculation (min) 56 min    Activity Tolerance Patient tolerated treatment well;No increased pain    Behavior During Therapy WFL for tasks assessed/performed             Past Medical History:  Diagnosis Date   Endometriosis    Kidney stone    Medical history non-contributory    Ovarian cyst     Past Surgical History:  Procedure Laterality Date   LAPAROSCOPY     TONSILLECTOMY      There were no vitals filed for this visit.   Subjective Assessment - 09/01/21 1505     Subjective Pt reported she had a sinus infection and tonsilitis, that's why she missed the last few weeks. She is feeling better this week but has not been able to perform HEP as often. Pt reported B hip pain incr. with lying in bed for so long. Pelvic pain incr. after cleaning her entire house in four hours, for the first time since her surgery as she normally splits it up over several days. She had intercourse that night and it was very painful. Pt reported she's had leakage during illness 2/2 strong cough. She had to change pants three times last week, as she does not wear underwear. She has not had to change pants at all this week 2/2 leakage. She stopped wearing underwear 2/2 she has hx of yeast and bacterial infections. Pt  reported B foot/ankle pain has been better.    Pertinent History Endometriosis, asthma, seasonal allergies, ovarian cyst    Patient Stated Goals To get back to how I should be, I feel older than I should be. Improve core strength, decr. pain.    Currently in Pain? No/denies                               [facility] Adult PT Treatment/Exercise - 09/01/21 1602       Manual Therapy   Manual Therapy Soft tissue mobilization    Manual therapy comments Pt reported she felt "taller", less tension and no pain after manual therapy.    Soft tissue mobilization PT performed STM to B QL and lats (R more tender and tense vs. L side), trigger point release to B diaphragm (lower attachment on L side and entire L diaphragm). PT performed fascial stretch with pt in sidelying applying pressure to iliac crest and lower ribs to stablize and decr. tension-performed B. PT assessed B hip add. and hip flexors with no TTP reported and no tension noted.  SELF CARE:  PT Education - 09/01/21 1605     Education Details PT discussed adding visits 2/2 missing appt's due to pt and PT illness, and that we will postpone STGs for 2 visits 2/2 missing appt's. PT reviewed HEP and educated pt on maintenence program for feet/ankle and to focus on relaxing pelvic floor (no contractions 2/2 pelvic pain) and stretching HEP. PT will perform internal muscle assessment next session.    Person(s) Educated Patient    Methods Explanation    Comprehension Verbalized understanding              PT Short Term Goals - 07/28/21 1525       PT SHORT TERM GOAL #1   Title Pt will be IND in HEP to improve strength, flexibility and pain. TARGET DATE FOR ALL unmet STGS: 08/25/21    Baseline No HEP. Partially met on 04/21/21 as she's performing 3-4x/week vs. daily. partially met 07/28/21: took nine days off while on vacation    Time 5    Period Weeks    Status Partially Met      PT SHORT TERM GOAL  #2   Title Pt will demonstrate ability to lengthen pelvic floor muscultature in order to relax pelvic floorand demo improved coordination to decr. pain (</=4/10) during pelvic exam, tampon insertion and intercourse.    Baseline 6/10 at worst baseline. 4-5/10 at worst 07/28/21    Time 5    Period Weeks    Status Partially Met      PT SHORT TERM GOAL #3   Title Pt will report she is able to stand at work for >7 hours without incr. in pain to work safely.    Baseline 6 hours (some days she can stand the entire 8 hours, some times approx. 4 hours: pt can do approx. 2 days per week at 8 hours)    Time 5    Period Weeks    Status Not Met               PT Long Term Goals - 07/28/21 1612       PT LONG TERM GOAL #1   Title Pt will demonstrate equal hip alignment and decr. LLD to improve posture and reduce pelvic and joint pain (</=2/10)while at work and performing ADLs. TARGET DATE FOR ALL unmet LTGS: 09/22/21    Baseline 6/10; pt said it did get up to 10/10 at baseline but is now 8/10 (pelvic floor).    Time 10    Period Weeks    Status On-going      PT LONG TERM GOAL #2   Title Pt will report going to the gym 3x/week  in order to maintain gains (strength) made during PT and improve QOL.    Baseline 0 times per week; pt has not gone to the gym yet    Time 10    Period Weeks    Status On-going      PT LONG TERM GOAL #3   Title Pt will report she did not have to change of clothes during the week 2/2 urinary leaking.    Baseline 2-3x/week baseline; 1-2 times a week    Time 10    Period Weeks    Status On-going      PT LONG TERM GOAL #4   Title Pt will demonstrate ability to lengthen pelvic floor muscultature in order to relax pelvic floorand demo improved coordination to decr. pain (</=2/10) during pelvic exam, tampon insertion and intercourse.  Baseline 6/10 baseline; 2-3/10 with insertion on 06/09/21    Time 10    Period Weeks    Status On-going      PT LONG TERM GOAL #5    Title Pt will improve urinary problem FOTO score to >/= 64 to improve QOL.    Baseline 53    Time 10    Period Weeks    Status On-going                   Plan - 09/01/21 1607     Clinical Impression Statement Today's skilled pain focused on decr. pelvic and hip pain, which likely incr. 2/2 pt's recent illness which incr. tension from pain, coughing and sneezing. Pt's incr. SUI is likely also 2/2 illness.  Pt continues to experience incr. ant. pelvic tilt in stance. Pt's B hip add/ and hip flexors demonstrated progress as pt denied TTP or tension during stretch. Pt's B QL, lats dorsi, and diaphragm however was noted to experience incr. tension which decr. with manual therapy. Pt's R and L SLS impaired as pt required several attmepts to stand without UE support with S for safety. Pt would continue to benefit from skilled PT to improve pain, balance, posture, and incontinence. STGs will be assessed next session 2/2 pt missing appt's.    Personal Factors and Comorbidities Comorbidity 3+    Comorbidities endometriosis, asthma, ovarian cyst, seasonal allergies.    Examination-Activity Limitations Continence;Locomotion Level;Stand;Toileting;Carry;Squat    Examination-Participation Restrictions Interpersonal Relationship;Driving;Cleaning;Occupation    Rehab Potential Good    PT Frequency 1x / week    PT Duration Other (comment)   1x/week for 4 weeks and then 3 visits every other week with additional 4 week extension 2/2 primary PT out.   PT Treatment/Interventions ADLs/Self Care Home Management;Biofeedback;Electrical Stimulation;Gait training;Functional mobility training;Therapeutic activities;Therapeutic exercise;Balance training;Neuromuscular re-education;Manual techniques;Spinal Manipulations;Joint Manipulations;Dry needling;Passive range of motion;Patient/family education;Taping    PT Next Visit Plan Review and modify HEP prn. Continue internal pelvic floor muscular manual therapy prn. assess  for LLD and treat as indicated. Manual therapy to psoas, TFL, adductors, hip ERs, diaphragm, QL and LD as needed. Progress HEP prn (add pelvic tilts prn). Continue B foot manual therapy and exercises, gait training. Quadruped activities.    PT Home Exercise Plan Medbridge: [email or web address]    Consulted and Agree with Plan of Care Patient             Patient will benefit from skilled therapeutic intervention in order to improve the following deficits and impairments:  Abnormal gait, Decreased range of motion, Decreased strength, Decreased mobility, Postural dysfunction, Impaired flexibility, Improper body mechanics, Pain  Visit Diagnosis: Other lack of coordination  Pelvic pain  Pain in right hip  Pain in left hip  Stress incontinence of urine  Muscle weakness (generalized)     Problem List There are no problems to display for this patient.   Daved Mcfann,Aaliah L, PT 09/01/2021, 4:13 PM  [place] MAIN [facility] SERVICES [address] [place], [place], [address] Phone: [phone]   Fax:  [phone]  Name: Crystal Riley MRN: [id number] Date of Birth: 1981/04/04   Geoffry Paradise, PT,DPT 09/01/21 4:13 PM Phone: [phone] Fax: [phone]

## 2021-09-15 ENCOUNTER — Ambulatory Visit: Payer: 59

## 2021-09-15 ENCOUNTER — Other Ambulatory Visit: Payer: Self-pay

## 2021-09-15 DIAGNOSIS — M25551 Pain in right hip: Secondary | ICD-10-CM

## 2021-09-15 DIAGNOSIS — M6281 Muscle weakness (generalized): Secondary | ICD-10-CM

## 2021-09-15 DIAGNOSIS — R278 Other lack of coordination: Secondary | ICD-10-CM

## 2021-09-15 DIAGNOSIS — R102 Pelvic and perineal pain: Secondary | ICD-10-CM

## 2021-09-15 NOTE — Therapy (Signed)
[place] MAIN [facility] SERVICES [address] [place], [place], [address] Phone: [phone]   Fax:  [phone]  Physical Therapy Treatment  Patient Details  Name: Crystal Riley MRN: [id number] Date of Birth: [birth year] Referring Provider (PT): Dr. Sloan Leiter   Encounter Date: 09/15/2021   PT End of Session - 09/15/21 1605     Visit Number 14    Number of Visits 17    Date for PT Re-Evaluation 09/15/21    Authorization Type [employer]- 20 PT visits per year: no visits used this year    Authorization - Visit Number 14    Authorization - Number of Visits 20    Progress Note Due on Visit 10    PT Start Time 1505    PT Stop Time 1600    PT Time Calculation (min) 55 min    Activity Tolerance Patient tolerated treatment well;No increased pain    Behavior During Therapy WFL for tasks assessed/performed             Past Medical History:  Diagnosis Date   Endometriosis    Kidney stone    Medical history non-contributory    Ovarian cyst     Past Surgical History:  Procedure Laterality Date   LAPAROSCOPY     TONSILLECTOMY      There were no vitals filed for this visit.   Subjective Assessment - 09/15/21 1508     Subjective Pt reported she still feels fatigued after illness. Pt is not taking motrin for pain every day. Pt stated she pulled muscles when lifting and placing a case in the car at the grocery store yesterday ( R side of back, hips, shoulders, etc). Pt had a lot of leakage after last visit during sneezing but was much better last week and this week. Pt reported HEP has been going well but the front of her R foot has been a little painful. Pt reported pelvic pain has been more tolerable. The morning after intercourse she has pain across the lower abdomen. She is using lubricant as needed and practicing mindfulness and breathing during intercourse.    Pertinent History Endometriosis, asthma, seasonal allergies, ovarian cyst     Patient Stated Goals To get back to how I should be, I feel older than I should be. Improve core strength, decr. pain.    Currently in Pain? Yes    Pain Score 4     Pain Location Flank    Pain Orientation Right    Pain Descriptors / Indicators Radiating;Aching;Shooting    Pain Type Acute pain    Pain Radiating Towards entire right side is painful    Pain Onset Yesterday    Pain Frequency Constant    Aggravating Factors  turning/rotating, sitting, walking    Pain Relieving Factors lying down                               [facility] Adult PT Treatment/Exercise - 09/15/21 1543       Manual Therapy   Manual Therapy Soft tissue mobilization;Internal Pelvic Floor    Manual therapy comments Pt reported pain reduced to 1/10 after manual therapy.    Soft tissue mobilization PT performed STM to R hip external rotators, hip extensors, and hamstring musculature to decr. pain and trigger points.    Internal Pelvic Floor PT performed manual therapy and trigger point release to R and L levator ani and oburator internus  musculature to decr. tension and pain. R side noted to have incr. tension. PT instructed pt to focus on breath to relax pelvic floor.                     PT Education - 09/15/21 1604     Education Details PT discussed using foam roller over R hip/LE musculature to reduce tension and pain.    Person(s) Educated Patient    Methods Explanation    Comprehension Verbalized understanding              PT Short Term Goals - 07/28/21 1525       PT SHORT TERM GOAL #1   Title Pt will be IND in HEP to improve strength, flexibility and pain. TARGET DATE FOR ALL unmet STGS: 08/25/21    Baseline No HEP. Partially met on 04/21/21 as she's performing 3-4x/week vs. daily. partially met 07/28/21: took nine days off while on vacation    Time 5    Period Weeks    Status Partially Met      PT SHORT TERM GOAL #2   Title Pt will demonstrate ability to lengthen pelvic  floor muscultature in order to relax pelvic floorand demo improved coordination to decr. pain (</=4/10) during pelvic exam, tampon insertion and intercourse.    Baseline 6/10 at worst baseline. 4-5/10 at worst 07/28/21    Time 5    Period Weeks    Status Partially Met      PT SHORT TERM GOAL #3   Title Pt will report she is able to stand at work for >7 hours without incr. in pain to work safely.    Baseline 6 hours (some days she can stand the entire 8 hours, some times approx. 4 hours: pt can do approx. 2 days per week at 8 hours)    Time 5    Period Weeks    Status Not Met               PT Long Term Goals - 07/28/21 1612       PT LONG TERM GOAL #1   Title Pt will demonstrate equal hip alignment and decr. LLD to improve posture and reduce pelvic and joint pain (</=2/10)while at work and performing ADLs. TARGET DATE FOR ALL unmet LTGS: 09/22/21    Baseline 6/10; pt said it did get up to 10/10 at baseline but is now 8/10 (pelvic floor).    Time 10    Period Weeks    Status On-going      PT LONG TERM GOAL #2   Title Pt will report going to the gym 3x/week  in order to maintain gains (strength) made during PT and improve QOL.    Baseline 0 times per week; pt has not gone to the gym yet    Time 10    Period Weeks    Status On-going      PT LONG TERM GOAL #3   Title Pt will report she did not have to change of clothes during the week 2/2 urinary leaking.    Baseline 2-3x/week baseline; 1-2 times a week    Time 10    Period Weeks    Status On-going      PT LONG TERM GOAL #4   Title Pt will demonstrate ability to lengthen pelvic floor muscultature in order to relax pelvic floorand demo improved coordination to decr. pain (</=2/10) during pelvic exam, tampon insertion and intercourse.    Baseline  6/10 baseline; 2-3/10 with insertion on 06/09/21    Time 10    Period Weeks    Status On-going      PT LONG TERM GOAL #5   Title Pt will improve urinary problem FOTO score to >/= 64  to improve QOL.    Baseline 53    Time 10    Period Weeks    Status On-going                   Plan - 09/15/21 1605     Clinical Impression Statement Today's skilled session session focused on reducing pelvic, hip and RLE pain with manual therapy. Pt noted to have incr. tension and pain in B pubococcygeus ( R side > L side) and R obturator internus, which all decr. after manual therapy. Pt's R sided flank, R hip, and RLE pain likley 2/2 to pt rotating when placing case of water from cart to car yesterday. Pt reported decr. in pain after manual therapy but PT again educated pt on how to reduce pain at home and to use proper body mechanics to decr. risk of injury again, which can incr. tension in pelvic floor. Pt's overall pain and SUI is improving but continues to incr. with sneezing and after intercourse, therefore, pt would continue to benefit from skilled PT to improve pain, SUI, strength, and posture during all ADLs.    Personal Factors and Comorbidities Comorbidity 3+    Comorbidities endometriosis, asthma, ovarian cyst, seasonal allergies.    Examination-Activity Limitations Continence;Locomotion Level;Stand;Toileting;Carry;Squat    Examination-Participation Restrictions Interpersonal Relationship;Driving;Cleaning;Occupation    Rehab Potential Good    PT Frequency 1x / week    PT Duration Other (comment)   1x/week for 4 weeks and then 3 visits every other week with additional 4 week extension 2/2 primary PT out.   PT Treatment/Interventions ADLs/Self Care Home Management;Biofeedback;Electrical Stimulation;Gait training;Functional mobility training;Therapeutic activities;Therapeutic exercise;Balance training;Neuromuscular re-education;Manual techniques;Spinal Manipulations;Joint Manipulations;Dry needling;Passive range of motion;Patient/family education;Taping    PT Next Visit Plan Continue internal pelvic floor muscular manual therapy prn.  LLE longer than RLE by 0.25".  Manual  therapy to psoas, TFL, adductors, hip ERs, diaphragm, QL and LD as needed. Progress HEP prn (add pelvic tilts prn). Continue B foot manual therapy and exercises, gait training. Quadruped activities.    PT Home Exercise Plan Medbridge: [email or web address]    Consulted and Agree with Plan of Care Patient             Patient will benefit from skilled therapeutic intervention in order to improve the following deficits and impairments:  Abnormal gait, Decreased range of motion, Decreased strength, Decreased mobility, Postural dysfunction, Impaired flexibility, Improper body mechanics, Pain  Visit Diagnosis: Pelvic pain  Pain in right hip  Other lack of coordination  Muscle weakness (generalized)     Problem List There are no problems to display for this patient.   Carnesha Maravilla,Selene L, PT 09/15/2021, 4:11 PM  [place] MAIN [facility] SERVICES [address] [place], [place], [address] Phone: [phone]   Fax:  [phone]  Name: JOMAYRA NOVITSKY MRN: [id number] Date of Birth: March 30, 1981  Geoffry Paradise, PT,DPT 09/15/21 4:11 PM Phone: [phone] Fax: [phone]

## 2021-10-01 ENCOUNTER — Other Ambulatory Visit: Payer: Self-pay

## 2021-10-01 ENCOUNTER — Ambulatory Visit: Payer: 59 | Attending: Obstetrics & Gynecology

## 2021-10-01 DIAGNOSIS — R102 Pelvic and perineal pain unspecified side: Secondary | ICD-10-CM

## 2021-10-01 DIAGNOSIS — N393 Stress incontinence (female) (male): Secondary | ICD-10-CM | POA: Diagnosis present

## 2021-10-01 DIAGNOSIS — R278 Other lack of coordination: Secondary | ICD-10-CM | POA: Diagnosis present

## 2021-10-01 DIAGNOSIS — M25552 Pain in left hip: Secondary | ICD-10-CM | POA: Diagnosis present

## 2021-10-01 DIAGNOSIS — M25551 Pain in right hip: Secondary | ICD-10-CM | POA: Diagnosis present

## 2021-10-01 DIAGNOSIS — M6281 Muscle weakness (generalized): Secondary | ICD-10-CM | POA: Diagnosis present

## 2021-10-01 NOTE — Patient Instructions (Signed)
Access Code: [email or web address] URL: [email or web address] Date: 10/01/2021 Prepared by: Zerita Boers  Exercises Supine Figure 4 Piriformis Stretch - 2-3 x daily - 7 x weekly - 1 sets - 3 reps - 30 hold Supine Diaphragmatic Breathing - 1 x daily - 7 x weekly - 3 sets - 10 reps Child's Pose with Sidebending - 1 x daily - 7 x weekly - 1 sets - 3 reps - 30 hold Supine Pelvic Floor Contraction - 2 x daily - 7 x weekly - 1 sets - 10 reps Gastroc Stretch on Wall - 2-3 x daily - 7 x weekly - 1 sets - 3 reps - 30 hold Gastroc Stretch with Foot at Wall - 2-3 x daily - 7 x weekly - 1 sets - 3 reps - 30 hold Standing Soleus Stretch - 1 x daily - 7 x weekly - 3 sets - 10 reps Seated Arch Lifts - 1 x daily - 7 x weekly - 1 sets - 10 reps Toe Spreading - 1 x daily - 7 x weekly - 1 sets - 10 reps Supine Pelvic Tilt - 1 x daily - 7 x weekly - 1 sets - 10 reps  Patient Education Lifting Techniques

## 2021-10-01 NOTE — Therapy (Signed)
[place] MAIN [facility] SERVICES [address] [place], [place], [address] Phone: [phone]   Fax:  [phone]  Physical Therapy Treatment  Patient Details  Name: Crystal Riley MRN: [id number] Date of Birth: 17-Nov-1980 Referring Provider (PT): Dr. Sloan Leiter   Encounter Date: 10/01/2021   PT End of Session - 10/01/21 1107     Visit Number 15    Number of Visits 17    Date for PT Re-Evaluation 09/15/21    Authorization Type U[employer]- 20 PT visits per year: no visits used this year    Authorization - Visit Number 15    Authorization - Number of Visits 20    Progress Note Due on Visit 10    PT Start Time 0902    PT Stop Time 0957    PT Time Calculation (min) 55 min    Activity Tolerance Patient tolerated treatment well;No increased pain    Behavior During Therapy WFL for tasks assessed/performed             Past Medical History:  Diagnosis Date   Endometriosis    Kidney stone    Medical history non-contributory    Ovarian cyst     Past Surgical History:  Procedure Laterality Date   LAPAROSCOPY     TONSILLECTOMY      There were no vitals filed for this visit.   Subjective Assessment - 10/01/21 0904     Subjective Pt reported this weekend was stressful as she there were rats in her home this weekend and she had to treat for rats. Pt reported she has a new role at work which requires more lifting, bending, squatting, and physical work which has incr. pelvic pain. However, pt has been helping in this role for a while now. Pt reported intercourse has been ok, and has tried some new positions. She didn't have to take Ibuprofen until the end of day yesterday. Pt reported SUI has been better, with only two leakage episodes in the last two weeks.              NMR: Access Code: [email or web address] URL: [email or web address] Date: 10/01/2021 Prepared by: Geoffry Paradise  Exercises Seated Arch Lifts - 1 x daily - 7 x weekly  - 1 sets - 10 reps for R foot Toe Spreading - 1 x daily - 7 x weekly - 1 sets - 10 reps for R foot Supine Pelvic Tilt - 1 x daily - 7 x weekly - 1 sets - 10 reps and performed in seated and quadruped x10 reps in each position, incr. Cues and demo during quadruped and seated. All performed with cues and demo for proper technique. S for safety.    Patient Education Lifting Techniques: reviewed lifting, squatting, sitting on the floor.  Scar mobilization side to side, vertical and approximation and circles.                 [place] Adult PT Treatment/Exercise - 10/01/21 0958       Manual Therapy   Manual Therapy Soft tissue mobilization    Manual therapy comments Pt reported less pain and tension after STM.    Soft tissue mobilization PT performed STM to R quadratus lumborum and external obliques to decr. tension.    Other Manual Therapy PT demonstrated scar mobilization as pt reported intermittent scar pain.                   SELF CARE:  PT Education - 10/01/21 1106  Education Details PT educated pt on proper lifting technique based on new job. PT also demonstrated pt how to sit on floor with towel under hips to decr. pain. PT updated HEP as indicated. PT discussed hold after next session for one month and one appt. to assess pelvis/hip/LE after new role begins.    Person(s) Educated Patient    Methods Explanation;Demonstration;Tactile cues;Verbal cues    Comprehension Returned demonstration;Verbalized understanding              PT Short Term Goals - 07/28/21 1525       PT SHORT TERM GOAL #1   Title Pt will be IND in HEP to improve strength, flexibility and pain. TARGET DATE FOR ALL unmet STGS: 08/25/21    Baseline No HEP. Partially met on 04/21/21 as she's performing 3-4x/week vs. daily. partially met 07/28/21: took nine days off while on vacation    Time 5    Period Weeks    Status Partially Met      PT SHORT TERM GOAL #2   Title Pt will demonstrate  ability to lengthen pelvic floor muscultature in order to relax pelvic floorand demo improved coordination to decr. pain (</=4/10) during pelvic exam, tampon insertion and intercourse.    Baseline 6/10 at worst baseline. 4-5/10 at worst 07/28/21    Time 5    Period Weeks    Status Partially Met      PT SHORT TERM GOAL #3   Title Pt will report she is able to stand at work for >7 hours without incr. in pain to work safely.    Baseline 6 hours (some days she can stand the entire 8 hours, some times approx. 4 hours: pt can do approx. 2 days per week at 8 hours)    Time 5    Period Weeks    Status Not Met               PT Long Term Goals - 10/01/21 1114       PT LONG TERM GOAL #1   Title Pt will demonstrate equal hip alignment and decr. LLD to improve posture and reduce pelvic and joint pain (</=2/10)while at work and performing ADLs. TARGET DATE FOR ALL unmet LTGS: 09/22/21; new LTG date: 11/15/21    Baseline 6/10; pt said it did get up to 10/10 at baseline but is now 8/10 (pelvic floor).    Time 10    Period Weeks    Status On-going      PT LONG TERM GOAL #2   Title Pt will report going to the gym 3x/week  in order to maintain gains (strength) made during PT and improve QOL.    Baseline 0 times per week; pt has not gone to the gym yet    Time 10    Period Weeks    Status On-going      PT LONG TERM GOAL #3   Title Pt will report she did not have to change of clothes during the week 2/2 urinary leaking.    Baseline 2-3x/week baseline; 1-2 times a week    Time 10    Period Weeks    Status On-going      PT LONG TERM GOAL #4   Title Pt will demonstrate ability to lengthen pelvic floor muscultature in order to relax pelvic floorand demo improved coordination to decr. pain (</=2/10) during pelvic exam, tampon insertion and intercourse.    Baseline 6/10 baseline; 2-3/10 with insertion on 06/09/21  Time 10    Period Weeks    Status On-going      PT LONG TERM GOAL #5   Title Pt  will improve urinary problem FOTO score to >/= 64 to improve QOL.    Baseline 53    Time 10    Period Weeks    Status On-going                   Plan - 10/01/21 1109     Clinical Impression Statement Pt demonstrated progress as she reported only two episodes of leakage in the last two weeks and no pain with intercourse. Pt has noticed incr. in pelvic pain after starting new job position, which is more physical. Today's skilled session focused on ensuring pt is performing proper lifting techniques and modifying HEP for to decr. pain during job. Pt's BLE equal during assessment and good mobility testing pelvis and sacrum today. Pt continues to experience difficulty coordinating pelvic tilts, scar mobility/sensation issues, incontinence and pain. Therefore, pt would continue to benefit from skilled PT. PT requesting 3 additional visits, including this visit, one in two weeks and then in one month.    PT Treatment/Interventions ADLs/Self Care Home Management;Biofeedback;Electrical Stimulation;Gait training;Functional mobility training;Therapeutic activities;Therapeutic exercise;Balance training;Neuromuscular re-education;Manual techniques;Spinal Manipulations;Joint Manipulations;Dry needling;Passive range of motion;Patient/family education;Taping    PT Next Visit Plan Continue internal pelvic floor muscular manual therapy prn.  LLE longer than RLE by 0.25".  Manual therapy to psoas, TFL, adductors, hip ERs, diaphragm, QL and LD as needed. Progress HEP prn (add pelvic tilts prn). Continue B foot manual therapy and exercises, gait training. Quadruped activities.    PT Home Exercise Plan Medbridge: [email or web address]    Consulted and Agree with Plan of Care Patient             Patient will benefit from skilled therapeutic intervention in order to improve the following deficits and impairments:  Abnormal gait, Decreased range of motion, Decreased strength, Decreased mobility, Postural dysfunction,  Impaired flexibility, Improper body mechanics, Pain  Visit Diagnosis: Pelvic pain - Plan: PT plan of care cert/re-cert  Pain in right hip - Plan: PT plan of care cert/re-cert  Other lack of coordination - Plan: PT plan of care cert/re-cert  Muscle weakness (generalized) - Plan: PT plan of care cert/re-cert  Stress incontinence of urine - Plan: PT plan of care cert/re-cert  Pain in left hip - Plan: PT plan of care cert/re-cert     Problem List There are no problems to display for this patient.   Cohan Stipes,Alka L, PT 10/01/2021, 11:16 AM  [place] MAIN [facility] SERVICES [address] [place], [place], [address] Phone: [phone]   Fax:  [phone]  Name: SYNETHIA ENDICOTT MRN: [id number] Date of Birth: 18-Jul-1981   Geoffry Paradise, PT,DPT 10/01/21 11:19 AM Phone: [phone] Fax: [phone]

## 2021-10-15 ENCOUNTER — Ambulatory Visit: Payer: 59

## 2021-11-03 ENCOUNTER — Other Ambulatory Visit: Payer: Self-pay

## 2021-11-03 ENCOUNTER — Ambulatory Visit: Payer: 59 | Attending: Obstetrics & Gynecology

## 2021-11-03 DIAGNOSIS — R102 Pelvic and perineal pain: Secondary | ICD-10-CM | POA: Diagnosis present

## 2021-11-03 DIAGNOSIS — M25552 Pain in left hip: Secondary | ICD-10-CM | POA: Diagnosis present

## 2021-11-03 DIAGNOSIS — M25551 Pain in right hip: Secondary | ICD-10-CM | POA: Diagnosis present

## 2021-11-03 DIAGNOSIS — N393 Stress incontinence (female) (male): Secondary | ICD-10-CM | POA: Diagnosis present

## 2021-11-03 DIAGNOSIS — R278 Other lack of coordination: Secondary | ICD-10-CM | POA: Diagnosis present

## 2021-11-03 NOTE — Therapy (Signed)
[place] [facility] SERVICES [address] [place], [place], [address] Phone: [phone]   Fax:  [phone]  Physical Therapy Treatment-Discharge  Patient Details  Name: Crystal Riley MRN: [id number] Date of Birth: [birth year] Referring Provider (PT): Dr. Sloan Leiter   Encounter Date: 11/03/2021   PT End of Session - 11/03/21 1559     Visit Number 16    Number of Visits 17    Date for PT Re-Evaluation 11/15/21    Authorization Type [employer]- 20 PT visits per year: no visits used this year    Authorization - Visit Number 16    Authorization - Number of Visits 20    Progress Note Due on Visit 10    PT Start Time 9794    PT Stop Time 8016   completed FOTO until 1603   PT Time Calculation (min) 53 min    Activity Tolerance Patient tolerated treatment well;No increased pain    Behavior During Therapy WFL for tasks assessed/performed             Past Medical History:  Diagnosis Date   Endometriosis    Kidney stone    Medical history non-contributory    Ovarian cyst     Past Surgical History:  Procedure Laterality Date   LAPAROSCOPY     TONSILLECTOMY      There were no vitals filed for this visit.   Subjective Assessment - 11/03/21 1509     Subjective Pt reported she is loving her new job. She is lifting more at work and moving around more at work. Pt reported a little bit of pelvic pain on and off. Pt reported her PFM feels more tight at the beginning of intercourse and it takes a bit to relax muscles. Pt reported intermittent B feet pain and has been performing feet stretches. Pt reported urinary leakage has been pretty good. She took a break from her allergy meds about one month ago.    Pertinent History Endometriosis, asthma, seasonal allergies, ovarian cyst    Patient Stated Goals To get back to how I should be, I feel older than I should be. Improve core strength, decr. pain.    Currently in Pain? Yes    Pain Score 3      Pain Location Shoulder    Pain Orientation Left;Right    Pain Descriptors / Indicators Aching    Pain Type Acute pain    Pain Onset Today    Pain Frequency Intermittent    Aggravating Factors  a lot of heavy lifting today    Pain Relieving Factors rest after work               NMR:  OBJECTIVE   Palpation/Segmental Motion/Joint Play: no TTP while assessing back musculature and LEs for tenderness, slight TTP over R ASIS. Spine mobility WNL  LLD not present during testing today.    Range of Motion/Flexibilty:  Spine: WFL and no pain Hips: B hip IR slightly limited, B hip ER WFL   Strength/MMT:  LE MMT  B knee ext: 4+/5 B knee flex: 4+/5 B ankle DF: 4/5 LE MMT Left Right  Hip flex:  (L2) 4+/5 4+/5  Hip ext: /5 /5  Hip abd: 4/5 3+/5  Hip add: 4/5 4/5  Hip IR /5 /5  Hip ER /5 /5      Access Code: [email or web address] URL: [email or web address] Date: 11/03/2021 Prepared by: Geoffry Paradise  Exercises Supine Figure 4 Piriformis Stretch - 2-3 x daily -  7 x weekly - 1 sets - 3 reps - 30 hold Supine Diaphragmatic Breathing - 1 x daily - 7 x weekly - 3 sets - 10 reps Child's Pose with Sidebending - 1 x daily - 7 x weekly - 1 sets - 3 reps - 30 hold Supine Pelvic Floor Contraction - 2 x daily - 7 x weekly - 1 sets - 10 reps Gastroc Stretch on Wall - 2-3 x daily - 7 x weekly - 1 sets - 3 reps - 30 hold Gastroc Stretch with Foot at Wall - 2-3 x daily - 7 x weekly - 1 sets - 3 reps - 30 hold Standing Soleus Stretch - 1 x daily - 7 x weekly - 3 sets - 10 reps Seated Arch Lifts - 1 x daily - 7 x weekly - 1 sets - 10 reps Toe Spreading - 1 x daily - 7 x weekly - 1 sets - 10 reps Supine Pelvic Tilt - 1 x daily - 7 x weekly - 1 sets - 10 reps Happy baby stretch x30 sec. 1 rep Butterfly stretch BLE 1x30 sec. hold  Performed with S for safety. Reviewed and grouped exercises into groups based on body part and/or when pain presents.                          PT Education - 11/03/21 1558     Education Details PT discussed goal progress and d/c. PT reviewed HEP and categorized exercises by body part and when to perform based on s/s. PT discussed going to the gym and warming up by walking/bike, then strength train and then stretch afterwards.    Person(s) Educated Patient    Methods Explanation;Handout    Comprehension Verbalized understanding;Returned demonstration              PT Short Term Goals - 07/28/21 1525       PT SHORT TERM GOAL #1   Title Pt will be IND in HEP to improve strength, flexibility and pain. TARGET DATE FOR ALL unmet STGS: 08/25/21    Baseline No HEP. Partially met on 04/21/21 as she's performing 3-4x/week vs. daily. partially met 07/28/21: took nine days off while on vacation    Time 5    Period Weeks    Status Partially Met      PT SHORT TERM GOAL #2   Title Pt will demonstrate ability to lengthen pelvic floor muscultature in order to relax pelvic floorand demo improved coordination to decr. pain (</=4/10) during pelvic exam, tampon insertion and intercourse.    Baseline 6/10 at worst baseline. 4-5/10 at worst 07/28/21    Time 5    Period Weeks    Status Partially Met      PT SHORT TERM GOAL #3   Title Pt will report she is able to stand at work for >7 hours without incr. in pain to work safely.    Baseline 6 hours (some days she can stand the entire 8 hours, some times approx. 4 hours: pt can do approx. 2 days per week at 8 hours)    Time 5    Period Weeks    Status Not Met               PT Long Term Goals - 11/03/21 1517       PT LONG TERM GOAL #1   Title Pt will demonstrate equal hip alignment and decr. LLD to improve posture and reduce  pelvic and joint pain (</=2/10)while at work and performing ADLs. TARGET DATE FOR ALL unmet LTGS: 09/22/21; new LTG date: 11/15/21    Baseline 6/10; pt said it did get up to 10/10 at baseline but is now 8/10 (pelvic floor).  11/03/21: 1-2/10 on average at work.    Time 10    Period Weeks    Status Achieved      PT LONG TERM GOAL #2   Title Pt will report going to the gym 3x/week  in order to maintain gains (strength) made during PT and improve QOL.    Baseline 0 times per week; pt has not gone to the gym yet. 11/03/21: not at gym yet.    Time 10    Period Weeks    Status Not Met      PT LONG TERM GOAL #3   Title Pt will report she did not have to change of clothes during the week 2/2 urinary leaking.    Baseline 2-3x/week baseline; 1-2 times a week, 11/03/21: once every other week.    Time 10    Period Weeks    Status Achieved      PT LONG TERM GOAL #4   Title Pt will demonstrate ability to lengthen pelvic floor muscultature in order to relax pelvic floor and demo improved coordination to decr. pain (</=2/10) during pelvic exam, tampon insertion and intercourse.    Baseline 6/10 baseline; 2-3/10 with insertion on 06/09/21; 11/03/21: 2/10.    Time 10    Period Weeks    Status Achieved      PT LONG TERM GOAL #5   Title Pt will improve urinary problem FOTO score to >/= 64 to improve QOL.    Baseline 53, 11/03/21: 63    Time 10    Period Weeks    Status Partially Met                   Plan - 11/03/21 1600     Clinical Impression Statement Today is pt's last visit. Please see d/c summary for details. Pt demonstrated progress as she met all LTGs, except for LTG 2 (has not gone to the gym) and partially met LTG 6 (FOTO-one point away from goal). Pt stated she feels 70% improved from baseline and will attempt going to the gym.    PT Treatment/Interventions ADLs/Self Care Home Management;Biofeedback;Electrical Stimulation;Gait training;Functional mobility training;Therapeutic activities;Therapeutic exercise;Balance training;Neuromuscular re-education;Manual techniques;Spinal Manipulations;Joint Manipulations;Dry needling;Passive range of motion;Patient/family education;Taping    PT Next Visit Plan d/c    PT  Home Exercise Plan Medbridge: [email or web address]    Consulted and Agree with Plan of Care Patient             Patient will benefit from skilled therapeutic intervention in order to improve the following deficits and impairments:  Abnormal gait, Decreased range of motion, Decreased strength, Decreased mobility, Postural dysfunction, Impaired flexibility, Improper body mechanics, Pain  Visit Diagnosis: Pelvic pain  Stress incontinence of urine  Pain in right hip  Pain in left hip  Other lack of coordination     Problem List There are no problems to display for this patient.   Kwinton Maahs,Margot L, PT 11/03/2021, 4:11 PM  [place] [facility] SERVICES [address] [place], [place], [address] Phone: [phone]   Fax:  [phone]  Name: ELVI LEVENTHAL MRN: [id number] Date of Birth: 1981/03/01   PHYSICAL THERAPY DISCHARGE SUMMARY  Visits from Start of Care: 16  Current functional level related  to goals / functional outcomes:  PT Long Term Goals - 11/03/21 1517       PT LONG TERM GOAL #1   Title Pt will demonstrate equal hip alignment and decr. LLD to improve posture and reduce pelvic and joint pain (</=2/10)while at work and performing ADLs. TARGET DATE FOR ALL unmet LTGS: 09/22/21; new LTG date: 11/15/21    Baseline 6/10; pt said it did get up to 10/10 at baseline but is now 8/10 (pelvic floor). 11/03/21: 1-2/10 on average at work.    Time 10    Period Weeks    Status Achieved      PT LONG TERM GOAL #2   Title Pt will report going to the gym 3x/week  in order to maintain gains (strength) made during PT and improve QOL.    Baseline 0 times per week; pt has not gone to the gym yet. 11/03/21: not at gym yet.    Time 10    Period Weeks    Status Not Met      PT LONG TERM GOAL #3   Title Pt will report she did not have to change of clothes during the week 2/2 urinary leaking.    Baseline 2-3x/week baseline; 1-2 times a week, 11/03/21: once  every other week.    Time 10    Period Weeks    Status Achieved      PT LONG TERM GOAL #4   Title Pt will demonstrate ability to lengthen pelvic floor muscultature in order to relax pelvic floor and demo improved coordination to decr. pain (</=2/10) during pelvic exam, tampon insertion and intercourse.    Baseline 6/10 baseline; 2-3/10 with insertion on 06/09/21; 11/03/21: 2/10.    Time 10    Period Weeks    Status Achieved      PT LONG TERM GOAL #5   Title Pt will improve urinary problem FOTO score to >/= 64 to improve QOL.    Baseline 53, 11/03/21: 63    Time 10    Period Weeks    Status Partially Met               Remaining deficits: Intermittent leakage and pelvic pain   Education / Equipment: HEP and gym instructions.   Patient agrees to discharge. Patient goals were met. Patient is being discharged due to meeting the stated rehab goals.  Geoffry Paradise, PT,DPT 11/03/21 4:16 PM Phone: [phone] Fax: [phone]

## 2021-11-03 NOTE — Patient Instructions (Signed)
Access Code: [email or web address] URL: [email or web address] Date: 11/03/2021 Prepared by: Zerita Boers  Exercises Supine Figure 4 Piriformis Stretch - 2-3 x daily - 7 x weekly - 1 sets - 3 reps - 30 hold Supine Diaphragmatic Breathing - 1 x daily - 7 x weekly - 3 sets - 10 reps Child's Pose with Sidebending - 1 x daily - 7 x weekly - 1 sets - 3 reps - 30 hold Supine Pelvic Floor Contraction - 2 x daily - 7 x weekly - 1 sets - 10 reps Gastroc Stretch on Wall - 2-3 x daily - 7 x weekly - 1 sets - 3 reps - 30 hold Gastroc Stretch with Foot at Wall - 2-3 x daily - 7 x weekly - 1 sets - 3 reps - 30 hold Standing Soleus Stretch - 1 x daily - 7 x weekly - 3 sets - 10 reps Seated Arch Lifts - 1 x daily - 7 x weekly - 1 sets - 10 reps Toe Spreading - 1 x daily - 7 x weekly - 1 sets - 10 reps Supine Pelvic Tilt - 1 x daily - 7 x weekly - 1 sets - 10 reps  Patient Education Lifting Techniques
